# Patient Record
Sex: Female | Born: 1960 | Race: Black or African American | Hispanic: No | Marital: Single | Smoking: Current every day smoker
Health system: Southern US, Community
[De-identification: ages and names within clinical notes are randomized; demographics above are authoritative.]

## PROBLEM LIST (undated history)

## (undated) DIAGNOSIS — N289 Disorder of kidney and ureter, unspecified: Secondary | ICD-10-CM

## (undated) DIAGNOSIS — B182 Chronic viral hepatitis C: Secondary | ICD-10-CM

## (undated) DIAGNOSIS — J45909 Unspecified asthma, uncomplicated: Secondary | ICD-10-CM

## (undated) DIAGNOSIS — I509 Heart failure, unspecified: Secondary | ICD-10-CM

## (undated) DIAGNOSIS — J449 Chronic obstructive pulmonary disease, unspecified: Secondary | ICD-10-CM

## (undated) DIAGNOSIS — E079 Disorder of thyroid, unspecified: Secondary | ICD-10-CM

## (undated) DIAGNOSIS — I10 Essential (primary) hypertension: Secondary | ICD-10-CM

## (undated) DIAGNOSIS — E119 Type 2 diabetes mellitus without complications: Secondary | ICD-10-CM

## (undated) DIAGNOSIS — N186 End stage renal disease: Secondary | ICD-10-CM

## (undated) DIAGNOSIS — Z992 Dependence on renal dialysis: Secondary | ICD-10-CM

## (undated) HISTORY — PX: COLON SURGERY: SHX602

## (undated) HISTORY — PX: TONSILLECTOMY: SUR1361

## (undated) HISTORY — PX: VASCULAR SURGERY: SHX849

---

## 2015-06-15 ENCOUNTER — Emergency Department (HOSPITAL_COMMUNITY)
Admission: EM | Admit: 2015-06-15 | Discharge: 2015-06-15 | Disposition: A | Discharge status: Home / Self Care | Payer: Medicaid - Out of State | Attending: Emergency Medicine | Admitting: Emergency Medicine

## 2015-06-15 ENCOUNTER — Encounter (HOSPITAL_COMMUNITY): Payer: Self-pay | Admitting: Emergency Medicine

## 2015-06-15 DIAGNOSIS — L02811 Cutaneous abscess of head [any part, except face]: Secondary | ICD-10-CM | POA: Insufficient documentation

## 2015-06-15 DIAGNOSIS — Z8619 Personal history of other infectious and parasitic diseases: Secondary | ICD-10-CM | POA: Diagnosis not present

## 2015-06-15 DIAGNOSIS — L0291 Cutaneous abscess, unspecified: Secondary | ICD-10-CM

## 2015-06-15 DIAGNOSIS — J45909 Unspecified asthma, uncomplicated: Secondary | ICD-10-CM | POA: Diagnosis not present

## 2015-06-15 DIAGNOSIS — Z720 Tobacco use: Secondary | ICD-10-CM | POA: Diagnosis not present

## 2015-06-15 DIAGNOSIS — Z87448 Personal history of other diseases of urinary system: Secondary | ICD-10-CM | POA: Diagnosis not present

## 2015-06-15 DIAGNOSIS — I10 Essential (primary) hypertension: Secondary | ICD-10-CM | POA: Insufficient documentation

## 2015-06-15 HISTORY — DX: Unspecified asthma, uncomplicated: J45.909

## 2015-06-15 HISTORY — DX: Essential (primary) hypertension: I10

## 2015-06-15 HISTORY — DX: Disorder of kidney and ureter, unspecified: N28.9

## 2015-06-15 HISTORY — DX: Chronic viral hepatitis C: B18.2

## 2015-06-15 MED ORDER — HYDROCODONE-ACETAMINOPHEN 5-325 MG PO TABS: ORAL_TABLET | ORAL | Status: DC

## 2015-06-15 MED ORDER — SULFAMETHOXAZOLE-TRIMETHOPRIM 800-160 MG PO TABS: 1.0000 | ORAL_TABLET | Freq: Two times a day (BID) | ORAL | Status: DC

## 2015-06-15 MED ORDER — LIDOCAINE HCL 2 % IJ SOLN
10.0000 mL | Freq: Once | INTRAMUSCULAR | Status: AC
  Administered 2015-06-15: 10 mg via INTRADERMAL
  Filled 2015-06-15: qty 20

## 2015-06-15 MED ORDER — OXYCODONE-ACETAMINOPHEN 5-325 MG PO TABS
1.0000 | ORAL_TABLET | Freq: Once | ORAL | Status: AC
  Administered 2015-06-15: 1 via ORAL
  Filled 2015-06-15: qty 1

## 2015-06-15 NOTE — ED Notes (Signed)
Pt is a dialysis pt. Developed abscess to scalp 5 days ago. Was given Vancomycin 2 Gm IV yesterday at dialysis. Pt has hx of Hep C, takes Plavix, hx of IV drug abuse (clean for 3 years).

## 2015-06-15 NOTE — Discharge Instructions (Signed)
Return to the emergency room for wound check and packing removal in 48 hours.       If you develop fever, have vomiting or if the swelling and redness starts spreading , return to the emergency room immediately for a recheck.  Please follow with your primary care doctor in the next 2 days for a check-up. They must obtain records for further management.   Do not hesitate to return to the Emergency Department for any new, worsening or concerning symptoms.    Abscess An abscess (boil or furuncle) is an infected area on or under the skin. This area is filled with yellowish-white fluid (pus) and other material (debris). HOME CARE   Only take medicines as told by your doctor.  If you were given antibiotic medicine, take it as directed. Finish the medicine even if you start to feel better.  If gauze is used, follow your doctor's directions for changing the gauze.  To avoid spreading the infection:  Keep your abscess covered with a bandage.  Wash your hands well.  Do not share personal care items, towels, or whirlpools with others.  Avoid skin contact with others.  Keep your skin and clothes clean around the abscess.  Keep all doctor visits as told. GET HELP RIGHT AWAY IF:   You have more pain, puffiness (swelling), or redness in the wound site.  You have more fluid or blood coming from the wound site.  You have muscle aches, chills, or you feel sick.  You have a fever. MAKE SURE YOU:   Understand these instructions.  Will watch your condition.  Will get help right away if you are not doing well or get worse. Document Released: 04/07/2008 Document Revised: 04/20/2012 Document Reviewed: 01/02/2012 [facility] Patient Information 2015 [place], [place]. This information is not intended to replace advice given to you by your health care provider. Make sure you discuss any questions you have with your health care provider.

## 2015-06-15 NOTE — ED Provider Notes (Signed)
CSN: [id number]     Arrival date & time 06/15/15  1049 History  This chart was scribed for non-physician practitioner Wynetta Emery, PA-C working with Lavera Guise, MD by Littie Deeds, ED Scribe. This patient was seen in room TR05C/TR05C and the patient's care was started at 11:02 AM.       Chief Complaint  Patient presents with  . Abscess   The history is provided by the patient. No language interpreter was used.   HPI Comments: Latoya Patterson is a 54 y.o. female with a history of hepatitis C and renal disorder on dialysis who presents to the Emergency Department complaining of an abscess to her scalp that started 5 days ago. Patient notes that it started as a bump that has increased in size, which she believes worsened after washing her hair. She was able to express some pus drainage this morning. She also reports having some facial swelling, for which she was given Vancomycin IV yesterday at dialysis. Patient denies fever and chills. She also denies head trauma. Patient is on Plavix. She has a history of prior abscesses.   Past Medical History  Diagnosis Date  . Asthma   . Hypertension   . Renal disorder   . Hep C w/ coma, chronic    Past Surgical History  Procedure Laterality Date  . Colon surgery    . Vascular surgery    . Tonsillectomy     No family history on file. Social History  Substance Use Topics  . Smoking status: Current Every Day Smoker  . Smokeless tobacco: None  . Alcohol Use: No   OB History    No data available     Review of Systems A complete 10 system review of systems was obtained and all systems are negative except as noted in the HPI and PMH.     Allergies  Review of patient's allergies indicates no known allergies.  Home Medications   Prior to Admission medications   Not on File   BP 150/72 mmHg  Pulse 78  Temp(Src) 98.1 F (36.7 C) (Oral)  Resp 16  SpO2 98% Physical Exam  Constitutional: She is oriented to person, place, and time. She  appears well-developed and well-nourished. No distress.  HENT:  Head: Normocephalic and atraumatic.    Mouth/Throat: Oropharynx is clear and moist. No oropharyngeal exudate.  Eyes: Pupils are equal, round, and reactive to light.  Neck: Neck supple.  Cardiovascular: Normal rate.   Pulmonary/Chest: Effort normal.  Musculoskeletal: She exhibits no edema.  Neurological: She is alert and oriented to person, place, and time. No cranial nerve deficit.  Skin: Skin is warm and dry. No rash noted.  Psychiatric: She has a normal mood and affect. Her behavior is normal.  Nursing note and vitals reviewed.   ED Course  INCISION AND DRAINAGE Date/Time: 06/15/2015 12:08 PM Performed by: Wynetta Emery Authorized by: Wynetta Emery Consent: Verbal consent obtained. Risks and benefits: risks, benefits and alternatives were discussed Consent given by: patient Required items: required blood products, implants, devices, and special equipment available Patient identity confirmed: verbally with patient Type: abscess Body area: head Location details: scalp Anesthesia: local infiltration Local anesthetic: lidocaine 1% without epinephrine Anesthetic total: 2 ml Patient sedated: no Scalpel size: 11 Incision type: single straight Complexity: simple Drainage: purulent Drainage amount: scant Wound treatment: wound left open Packing material: 1/4 in iodoform gauze Patient tolerance: Patient tolerated the procedure well with no immediate complications    DIAGNOSTIC STUDIES: Oxygen Saturation is 98%  on room air, normal by my interpretation.    COORDINATION OF CARE: 11:05 AM-Discussed treatment plan which includes pain medications and I&D with patient/guardian at bedside and patient/guardian agreed to plan. Patient is not driving herself home.   Labs Review Labs Reviewed - No data to display  Imaging Review No results found. I personally reviewed and evaluated these images and lab results  as part of my medical decision-making.   EKG Interpretation None      MDM   Final diagnoses:  Abscess    Filed Vitals:   06/15/15 1056  BP: 150/72  Pulse: 78  Temp: 98.1 F (36.7 C)  TempSrc: Oral  Resp: 16  SpO2: 98%    Medications  oxyCODONE-acetaminophen (PERCOCET/ROXICET) 5-325 MG per tablet 1 tablet (1 tablet Oral Given 06/15/15 1113)  lidocaine (XYLOCAINE) 2 % (with pres) injection 200 mg (10 mg Intradermal Given 06/15/15 1113)    Latoya Patterson is a pleasant 54 y.o. female presenting with scalp abscess. No signs of systemic infection. I and D performed and wound is packed, there is a very scant amount of purulent drainage. Patient was extremely uncomfortable during the procedure so there was not an aggressive lysis of loculations. Patient is started on Bactrim. I have discussed this with pharmacist Homero Fellers considering she is on hemodialysis and he recommended standard treatment with no dosage adjustment for the hemodialysis.  Evaluation does not show pathology that would require ongoing emergent intervention or inpatient treatment. Pt is hemodynamically stable and mentating appropriately. Discussed findings and plan with patient/guardian, who agrees with care plan. All questions answered. Return precautions discussed and outpatient follow up given.   New Prescriptions   No medications on file   I personally performed the services described in this documentation, which was scribed in my presence. The recorded information has been reviewed and is accurate.   Wynetta Emery, PA-C 06/15/15 1245  Lavera Guise, MD 06/15/15 2022

## 2017-07-08 ENCOUNTER — Inpatient Hospital Stay (HOSPITAL_COMMUNITY)
Admission: EM | Admit: 2017-07-08 | Discharge: 2017-07-11 | DRG: 640 | Disposition: A | Discharge status: Home / Self Care | Payer: Medicaid - Out of State | Attending: Oncology | Admitting: Oncology

## 2017-07-08 ENCOUNTER — Other Ambulatory Visit: Payer: Self-pay

## 2017-07-08 ENCOUNTER — Emergency Department (HOSPITAL_COMMUNITY): Payer: Medicaid - Out of State

## 2017-07-08 ENCOUNTER — Encounter (HOSPITAL_COMMUNITY): Payer: Self-pay | Admitting: *Deleted

## 2017-07-08 DIAGNOSIS — I10 Essential (primary) hypertension: Secondary | ICD-10-CM

## 2017-07-08 DIAGNOSIS — F259 Schizoaffective disorder, unspecified: Secondary | ICD-10-CM | POA: Diagnosis present

## 2017-07-08 DIAGNOSIS — L97829 Non-pressure chronic ulcer of other part of left lower leg with unspecified severity: Secondary | ICD-10-CM | POA: Diagnosis present

## 2017-07-08 DIAGNOSIS — L97819 Non-pressure chronic ulcer of other part of right lower leg with unspecified severity: Secondary | ICD-10-CM | POA: Diagnosis present

## 2017-07-08 DIAGNOSIS — Z7902 Long term (current) use of antithrombotics/antiplatelets: Secondary | ICD-10-CM

## 2017-07-08 DIAGNOSIS — I5022 Chronic systolic (congestive) heart failure: Secondary | ICD-10-CM | POA: Diagnosis not present

## 2017-07-08 DIAGNOSIS — N186 End stage renal disease: Secondary | ICD-10-CM | POA: Diagnosis not present

## 2017-07-08 DIAGNOSIS — Z7982 Long term (current) use of aspirin: Secondary | ICD-10-CM

## 2017-07-08 DIAGNOSIS — I5020 Unspecified systolic (congestive) heart failure: Secondary | ICD-10-CM | POA: Diagnosis present

## 2017-07-08 DIAGNOSIS — I4581 Long QT syndrome: Secondary | ICD-10-CM | POA: Diagnosis present

## 2017-07-08 DIAGNOSIS — Z992 Dependence on renal dialysis: Secondary | ICD-10-CM

## 2017-07-08 DIAGNOSIS — N289 Disorder of kidney and ureter, unspecified: Secondary | ICD-10-CM

## 2017-07-08 DIAGNOSIS — K5900 Constipation, unspecified: Secondary | ICD-10-CM | POA: Diagnosis present

## 2017-07-08 DIAGNOSIS — B182 Chronic viral hepatitis C: Secondary | ICD-10-CM | POA: Diagnosis present

## 2017-07-08 DIAGNOSIS — I132 Hypertensive heart and chronic kidney disease with heart failure and with stage 5 chronic kidney disease, or end stage renal disease: Secondary | ICD-10-CM | POA: Diagnosis not present

## 2017-07-08 DIAGNOSIS — E119 Type 2 diabetes mellitus without complications: Secondary | ICD-10-CM

## 2017-07-08 DIAGNOSIS — F17210 Nicotine dependence, cigarettes, uncomplicated: Secondary | ICD-10-CM | POA: Diagnosis present

## 2017-07-08 DIAGNOSIS — I509 Heart failure, unspecified: Secondary | ICD-10-CM

## 2017-07-08 DIAGNOSIS — J449 Chronic obstructive pulmonary disease, unspecified: Secondary | ICD-10-CM

## 2017-07-08 DIAGNOSIS — F209 Schizophrenia, unspecified: Secondary | ICD-10-CM

## 2017-07-08 DIAGNOSIS — E1122 Type 2 diabetes mellitus with diabetic chronic kidney disease: Secondary | ICD-10-CM | POA: Diagnosis not present

## 2017-07-08 DIAGNOSIS — E1121 Type 2 diabetes mellitus with diabetic nephropathy: Secondary | ICD-10-CM

## 2017-07-08 DIAGNOSIS — Z79899 Other long term (current) drug therapy: Secondary | ICD-10-CM

## 2017-07-08 DIAGNOSIS — Z794 Long term (current) use of insulin: Secondary | ICD-10-CM

## 2017-07-08 DIAGNOSIS — E8770 Fluid overload, unspecified: Principal | ICD-10-CM | POA: Diagnosis present

## 2017-07-08 DIAGNOSIS — Z9115 Patient's noncompliance with renal dialysis: Secondary | ICD-10-CM

## 2017-07-08 DIAGNOSIS — E11622 Type 2 diabetes mellitus with other skin ulcer: Secondary | ICD-10-CM | POA: Diagnosis present

## 2017-07-08 DIAGNOSIS — B1920 Unspecified viral hepatitis C without hepatic coma: Secondary | ICD-10-CM

## 2017-07-08 DIAGNOSIS — E039 Hypothyroidism, unspecified: Secondary | ICD-10-CM

## 2017-07-08 HISTORY — DX: Chronic obstructive pulmonary disease, unspecified: J44.9

## 2017-07-08 HISTORY — DX: Heart failure, unspecified: I50.9

## 2017-07-08 HISTORY — DX: Type 2 diabetes mellitus without complications: E11.9

## 2017-07-08 HISTORY — DX: Disorder of thyroid, unspecified: E07.9

## 2017-07-08 HISTORY — DX: End stage renal disease: N18.6

## 2017-07-08 HISTORY — DX: Dependence on renal dialysis: Z99.2

## 2017-07-08 LAB — COMPREHENSIVE METABOLIC PANEL
ALBUMIN: 3.2 g/dL — AB (ref 3.5–5.0)
ALK PHOS: 180 U/L — AB (ref 38–126)
ALT: 18 U/L (ref 14–54)
ANION GAP: 16 — AB (ref 5–15)
AST: 30 U/L (ref 15–41)
BILIRUBIN TOTAL: 0.7 mg/dL (ref 0.3–1.2)
BUN: 66 mg/dL — ABNORMAL HIGH (ref 6–20)
CALCIUM: 9.3 mg/dL (ref 8.9–10.3)
CO2: 24 mmol/L (ref 22–32)
CREATININE: 11.26 mg/dL — AB (ref 0.44–1.00)
Chloride: 98 mmol/L — ABNORMAL LOW (ref 101–111)
GFR, EST AFRICAN AMERICAN: 4 mL/min — AB (ref >60)
GFR, EST NON AFRICAN AMERICAN: 3 mL/min — AB (ref >60)
Glucose, Bld: 353 mg/dL — ABNORMAL HIGH (ref 65–99)
Potassium: 4.3 mmol/L (ref 3.5–5.1)
SODIUM: 138 mmol/L (ref 135–145)
TOTAL PROTEIN: 7.3 g/dL (ref 6.5–8.1)

## 2017-07-08 LAB — CBC WITH DIFFERENTIAL/PLATELET
BASOS PCT: 0 %
Basophils Absolute: 0 10*3/uL (ref 0.0–0.1)
EOS ABS: 0.2 10*3/uL (ref 0.0–0.7)
Eosinophils Relative: 3 %
HCT: 34.1 % — ABNORMAL LOW (ref 36.0–46.0)
Hemoglobin: 10.4 g/dL — ABNORMAL LOW (ref 12.0–15.0)
Lymphocytes Relative: 31 %
Lymphs Abs: 1.4 10*3/uL (ref 0.7–4.0)
MCH: 27.2 pg (ref 26.0–34.0)
MCHC: 30.5 g/dL (ref 30.0–36.0)
MCV: 89.3 fL (ref 78.0–100.0)
MONOS PCT: 5 %
Monocytes Absolute: 0.2 10*3/uL (ref 0.1–1.0)
Neutro Abs: 2.7 10*3/uL (ref 1.7–7.7)
Neutrophils Relative %: 61 %
PLATELETS: 165 10*3/uL (ref 150–400)
RBC: 3.82 MIL/uL — ABNORMAL LOW (ref 3.87–5.11)
RDW: 15.3 % (ref 11.5–15.5)
WBC: 4.5 10*3/uL (ref 4.0–10.5)

## 2017-07-08 MED ORDER — SODIUM CHLORIDE 0.9% FLUSH: 3.0000 mL | INTRAVENOUS | Status: DC | PRN

## 2017-07-08 MED ORDER — SODIUM CHLORIDE 0.9 % IV SOLN: 250.0000 mL | INTRAVENOUS | Status: DC | PRN

## 2017-07-08 MED ORDER — CLONIDINE HCL 0.3 MG PO TABS
0.3000 mg | ORAL_TABLET | Freq: Three times a day (TID) | ORAL | Status: DC
  Administered 2017-07-08 – 2017-07-11 (×8): 0.3 mg via ORAL
  Filled 2017-07-08 (×8): qty 1

## 2017-07-08 MED ORDER — IPRATROPIUM-ALBUTEROL 0.5-2.5 (3) MG/3ML IN SOLN
3.0000 mL | Freq: Once | RESPIRATORY_TRACT | Status: AC
  Administered 2017-07-08: 3 mL via RESPIRATORY_TRACT
  Filled 2017-07-08: qty 3

## 2017-07-08 MED ORDER — INSULIN ASPART 100 UNIT/ML ~~LOC~~ SOLN
3.0000 [IU] | Freq: Three times a day (TID) | SUBCUTANEOUS | Status: DC
  Administered 2017-07-09 – 2017-07-10 (×3): 3 [IU] via SUBCUTANEOUS

## 2017-07-08 MED ORDER — DOCUSATE SODIUM 100 MG PO CAPS: 100.0000 mg | ORAL_CAPSULE | Freq: Every day | ORAL | Status: DC

## 2017-07-08 MED ORDER — HEPARIN SODIUM (PORCINE) 5000 UNIT/ML IJ SOLN
5000.0000 [IU] | Freq: Three times a day (TID) | INTRAMUSCULAR | Status: DC
  Administered 2017-07-08 – 2017-07-11 (×6): 5000 [IU] via SUBCUTANEOUS
  Filled 2017-07-08 (×6): qty 1

## 2017-07-08 MED ORDER — LISINOPRIL 10 MG PO TABS
10.0000 mg | ORAL_TABLET | Freq: Every day | ORAL | Status: DC
  Administered 2017-07-08 – 2017-07-11 (×4): 10 mg via ORAL
  Filled 2017-07-08 (×4): qty 1

## 2017-07-08 MED ORDER — ASPIRIN EC 81 MG PO TBEC
81.0000 mg | DELAYED_RELEASE_TABLET | Freq: Every day | ORAL | Status: DC
  Administered 2017-07-09 – 2017-07-11 (×4): 81 mg via ORAL
  Filled 2017-07-08 (×4): qty 1

## 2017-07-08 MED ORDER — NITROGLYCERIN 0.4 MG/HR TD PT24
0.4000 mg | MEDICATED_PATCH | Freq: Every day | TRANSDERMAL | Status: DC
  Administered 2017-07-08 – 2017-07-11 (×4): 0.4 mg via TRANSDERMAL
  Filled 2017-07-08 (×4): qty 1

## 2017-07-08 MED ORDER — INSULIN GLARGINE 100 UNIT/ML ~~LOC~~ SOLN
35.0000 [IU] | Freq: Every day | SUBCUTANEOUS | Status: DC
  Administered 2017-07-08 – 2017-07-10 (×3): 35 [IU] via SUBCUTANEOUS
  Filled 2017-07-08 (×4): qty 0.35

## 2017-07-08 MED ORDER — SENNOSIDES-DOCUSATE SODIUM 8.6-50 MG PO TABS
2.0000 | ORAL_TABLET | Freq: Every day | ORAL | Status: DC
  Administered 2017-07-08 – 2017-07-11 (×4): 2 via ORAL
  Filled 2017-07-08 (×4): qty 2

## 2017-07-08 MED ORDER — SIMVASTATIN 20 MG PO TABS
20.0000 mg | ORAL_TABLET | Freq: Every day | ORAL | Status: DC
  Administered 2017-07-08 – 2017-07-10 (×3): 20 mg via ORAL
  Filled 2017-07-08 (×3): qty 1

## 2017-07-08 MED ORDER — INSULIN ASPART 100 UNIT/ML ~~LOC~~ SOLN
0.0000 [IU] | Freq: Three times a day (TID) | SUBCUTANEOUS | Status: DC
  Administered 2017-07-09: 3 [IU] via SUBCUTANEOUS
  Administered 2017-07-09: 7 [IU] via SUBCUTANEOUS
  Administered 2017-07-10: 5 [IU] via SUBCUTANEOUS

## 2017-07-08 MED ORDER — PREDNISONE 20 MG PO TABS
60.0000 mg | ORAL_TABLET | Freq: Once | ORAL | Status: AC
Start: 2017-07-08 — End: 2017-07-08
  Administered 2017-07-08: 60 mg via ORAL
  Filled 2017-07-08: qty 3

## 2017-07-08 MED ORDER — QUETIAPINE FUMARATE 50 MG PO TABS
300.0000 mg | ORAL_TABLET | Freq: Two times a day (BID) | ORAL | Status: DC
  Administered 2017-07-09 – 2017-07-10 (×4): 300 mg via ORAL
  Filled 2017-07-08 (×3): qty 6
  Filled 2017-07-08 (×2): qty 1
  Filled 2017-07-08 (×2): qty 6

## 2017-07-08 MED ORDER — SODIUM CHLORIDE 0.9% FLUSH
3.0000 mL | Freq: Two times a day (BID) | INTRAVENOUS | Status: DC
  Administered 2017-07-09 – 2017-07-10 (×4): 3 mL via INTRAVENOUS

## 2017-07-08 MED ORDER — NIFEDIPINE ER OSMOTIC RELEASE 90 MG PO TB24
90.0000 mg | ORAL_TABLET | Freq: Every day | ORAL | Status: DC
  Administered 2017-07-08 – 2017-07-11 (×4): 90 mg via ORAL
  Filled 2017-07-08 (×4): qty 1

## 2017-07-08 MED ORDER — CLOPIDOGREL BISULFATE 75 MG PO TABS
75.0000 mg | ORAL_TABLET | Freq: Every day | ORAL | Status: DC
  Administered 2017-07-09 – 2017-07-11 (×4): 75 mg via ORAL
  Filled 2017-07-08 (×4): qty 1

## 2017-07-08 NOTE — H&P (Signed)
Date: 07/08/2017               Patient Name:  Latoya Patterson MRN: [id number]  DOB: September 02, 1961 Age / Sex: 56 y.o., female   PCP: No primary care provider on file.         Medical Service: Internal Medicine Teaching Service         Attending Physician: Dr. Cyndie ChimeGranfortuna, Genene ChurnJames M, MD    First Contact: Dr. Rogue BussingSantos-Snchez Pager: [phone]  Second Contact: Dr. Allena KatzPatel  Pager: [phone]       After Hours (After 5p/  First Contact Pager: [phone]  weekends / holidays): Second Contact Pager: [phone]   Chief Complaint: Volume overload   History of Present Illness:  Latoya Patterson is a 56 y.o. female with history of ESRD on HD, CHF, insulin-dependent T2DM, HTN, hypothyroidism,  COPD/asthma, HepC, and schizoaffective disorder who presents to the ED with volume overload after missing HD for 1 week. Patient lives in [place] and is visiting family in [place] and is planning to stay for 1 month. States that she got prior authorization from her insurance to receive HD in [place], but was told she could not receive HD at the dialysis center she went to. Endorses mild SOB, stating this is usually how she presents after missing HD. Denied chest pain, chest pressure, HA, cough, abdominal pain, and changes in bowel movements.   ED course: BP 192/81 in the setting of not taking medication today. She received clonidine 0.3 mg and lisinopril 10 with mild improvement in BP. She also received duoneb and PO prednisone 60 mg.   Meds:  ASA 81 mg daily  Nitro patch 0.4 mg q25h  Simvastatin 20 mg daily  Plavix 75 mg daily  Clonidine 0.3 mg TID  Lisinopril 10 mg daily  Procardia 90 mg daily  Lantus 50U QHS  Novolog 5U TID Seroquel 300 mg BID   Senokot 2 tablets daily   Allergies: Allergies as of 07/08/2017  . (No Known Allergies)   Past Medical History:  Diagnosis Date  . Asthma   . CHF (congestive heart failure) (HCC)   . COPD (chronic obstructive pulmonary disease) (HCC)   . Diabetes mellitus without complication (HCC)     . ESRD (end stage renal disease) on dialysis (HCC)   . Hep C w/ coma, chronic (HCC)   . Hypertension   . Renal disorder   . Thyroid disease     Family History:  Patient is adopted.   Social History:  Patient lives alone in [place]. Unemployed. Denies tobacco use and alcohol use. Reports marijuana use.   Review of Systems: A complete ROS was negative except as per HPI.   Physical Exam: Blood pressure (!) 197/71, pulse 71, temperature 98.1 F (36.7 C), temperature source Oral, resp. rate 13, SpO2 97 %.  General: pleasant female, well-nourished, well-developed, in no acute distress  Cardiac: regular rate and rhythm, nl S1/S2, II/VI systolic murmur best heard over upper L and R sternal border  Pulm: CTAB, no wheezes or crackles, no increased work of breathing  Abd: soft, NTND, bowel sounds present  Ext: warm and well perfused, mild peripheral edema bilaterally, 2+ DP pulses bilaterally  Derm: several open wounds on L and R LE, ulcers noted at L 2nd toe and R 3rd toe  Labs:  CBC  WBC 4.5  H/H 10.4/34.1  PLt 165 CMP 138  4.3  98  24  66  11.26  353 AST/ALT 30/18  AP 180 Alb 3.2  Ca 9.3  TP 7.3  TBili 0.7   EKG: personally reviewed my interpretation is ST at 78 bpm, QTC , no axis deviation, no LVH, no signs of acute ischemia   CXR: personally reviewed my interpretation is patent airway, no enlargement of cardiac silhouette, increased pulmonary vasculature markings, no focal consolidations/opacities/effusions noted   Assessment & Plan by Problem:  [name] is a 56 y.o. female with history of ESRD on HD, CHF, insulin-dependent T2DM, HTN, hypothyroidism,  COPD/asthma, HepC, and schizoaffective disorder who presents to the ED with volume overload after missing HD for 1 week.   # ESRD on HD p/w volume overload: in the setting of missing HD for 1 week  - Nephrology consulted. Patient will get HD tomorrow   # LE wounds:  - WOC consult   # HFrEF:  Unknown EF as patient lives in  [place] and no records on [place]  - Continue home ASA 81 mg daily  - Nitro patch 0.4 mg q25h  - Continue Simvastatin 20 mg daily  - Continue home Plavix 75 mg daily   # HTN: sBP 190s in the setting of not taking medication today  - Continue home Clonidine 0.3 mg TID + Lisinopril 10 mg daily + Procardia 90 mg daily   # T2DM: On Lantus 50U QHS at home   - Lantus 35U QHS + Novolog 3U TID + SSI-S - CBG monitoring  - Check A1c   # Hypothyroidism: no TSH on record. Patient does not know Synthroid dose.     # Schizoaffective disorder:  - Continue home Seroquel 300 mg BID    # Constipation:  - Continue home Senokot 2 tablets daily   Fluids: None  Electrolytes: CTM  GI: Renal diet  VTE ppx: SQ heparin   Code status: Full code, not confirmed on admission   Dispo: Admit patient to Observation with expected length of stay less than 2 midnights.  Signed: Burna Cash, MD  Internal Medicine PGY-1  [phone]

## 2017-07-08 NOTE — ED Notes (Signed)
Called pt for room no answer 

## 2017-07-08 NOTE — ED Provider Notes (Signed)
[facility]-EMERGENCY DEPT Provider Note   CSN: [id number] Arrival date & time: 07/08/17  1331     History   Chief Complaint Chief Complaint  Patient presents with  . Shortness of Breath    needs HD     HPI   Blood pressure (!) 198/77, pulse 79, temperature 98.1 F (36.7 C), temperature source Oral, resp. rate 17, SpO2 96 %.  Latoya Patterson is a 56 y.o. female with past medical history significant for COPD, hepatitis C, hypertension, ESRD on dialysis, visiting family from [place], was last dialyzed 6 days ago. She states that she started feeling short of breath yesterday but significantly worsened today. She states that she got preapproval from her insurance company to dialyze in [place] however when she went to the hemodialysis center on [address] they told her there was an issue and advised her to go to the emergency department.patient denies any chest pain. She endorses significant and painful lower tremor he peripheral edema bilaterally. She states that C feel she is volume overloaded no she needs to be dialyzed.  Past Medical History:  Diagnosis Date  . Asthma   . CHF (congestive heart failure) (HCC)   . COPD (chronic obstructive pulmonary disease) (HCC)   . Diabetes mellitus without complication (HCC)   . ESRD (end stage renal disease) on dialysis (HCC)   . Hep C w/ coma, chronic (HCC)   . Hypertension   . Renal disorder   . Thyroid disease     Patient Active Problem List   Diagnosis Date Noted  . Volume overload 07/08/2017  . Hepatitis C 07/08/2017  . COPD (chronic obstructive pulmonary disease) (HCC) 07/08/2017  . Heart failure (HCC) 07/08/2017  . Diabetes (HCC) 07/08/2017  . Schizoaffective disorder (HCC) 07/08/2017  . Constipation 07/08/2017  . Hypothyroidism 07/08/2017  . Hypertension   . ESRD (end stage renal disease) on dialysis [facility])     Past Surgical History:  Procedure Laterality Date  . COLON SURGERY    . TONSILLECTOMY    . VASCULAR  SURGERY      OB History    No data available       Home Medications    Prior to Admission medications   Medication Sig Start Date End Date Taking? Authorizing Provider  HYDROcodone-acetaminophen (NORCO/VICODIN) 5-325 MG per tablet Take 1-2 tablets by mouth every 6 hours as needed for pain and/or cough. 06/15/15   Nikala Walsworth, Joni Reining, PA-C  sulfamethoxazole-trimethoprim (BACTRIM DS) 800-160 MG per tablet Take 1 tablet by mouth 2 (two) times daily. 06/15/15   Ashlea Dusing, Joni Reining, PA-C    Family History Family History  Problem Relation Age of Onset  . Adopted: Yes    Social History Social History  Substance Use Topics  . Smoking status: Current Every Day Smoker    Packs/day: 0.33  . Smokeless tobacco: Never Used  . Alcohol use No     Allergies   Patient has no known allergies.   Review of Systems Review of Systems  A complete review of systems was obtained and all systems are negative except as noted in the HPI and PMH.    Physical Exam Updated Vital Signs BP (!) 229/88 (BP Location: Right Arm)   Pulse 83   Temp 97.9 F (36.6 C) (Oral)   Resp 15   Wt 93.6 kg (206 lb 4.8 oz)   SpO2 100%   Physical Exam  Constitutional: She is oriented to person, place, and time. She appears well-developed and well-nourished. No distress.  HENT:  Head: Normocephalic and atraumatic.  Mouth/Throat: Oropharynx is clear and moist.  Eyes: Pupils are equal, round, and reactive to light. Conjunctivae and EOM are normal.  Neck: Normal range of motion.  Cardiovascular: Normal rate, regular rhythm and intact distal pulses.   Pulmonary/Chest: Effort normal. She has wheezes.  Scattered moderate expiratory wheezing  Abdominal: Soft. There is no tenderness.  Musculoskeletal: Normal range of motion. She exhibits edema.  2+ pitting edema to midshin  Neurological: She is alert and oriented to person, place, and time.  Skin: She is not diaphoretic.  Psychiatric: She has a normal mood and  affect.  Nursing note and vitals reviewed.    ED Treatments / Results  Labs (all labs ordered are listed, but only abnormal results are displayed) Labs Reviewed  CBC WITH DIFFERENTIAL/PLATELET - Abnormal; Notable for the following:       Result Value   RBC 3.82 (*)    Hemoglobin 10.4 (*)    HCT 34.1 (*)    All other components within normal limits  COMPREHENSIVE METABOLIC PANEL - Abnormal; Notable for the following:    Chloride 98 (*)    Glucose, Bld 353 (*)    BUN 66 (*)    Creatinine, Ser 11.26 (*)    Albumin 3.2 (*)    Alkaline Phosphatase 180 (*)    GFR calc non Af Amer 3 (*)    GFR calc Af Amer 4 (*)    Anion gap 16 (*)    All other components within normal limits  HEMOGLOBIN A1C  RENAL FUNCTION PANEL  HIV ANTIBODY (ROUTINE TESTING)    EKG  EKG Interpretation None       Radiology Dg Chest 2 View  Result Date: 07/08/2017 CLINICAL DATA:  Shortness of Breath.  Renal failure EXAM: CHEST  2 VIEW COMPARISON:  None. FINDINGS: There is no edema or consolidation. There is cardiomegaly with pulmonary venous hypertension. No adenopathy. Left bracheal and axillary stent present. No evident bone lesions. IMPRESSION: Pulmonary vascular congestion without frank edema or consolidation. Electronically Signed   By: Bretta BangWilliam  Woodruff III M.D.   On: 07/08/2017 14:15    Procedures Procedures (including critical care time)  Medications Ordered in ED Medications  insulin glargine (LANTUS) injection 35 Units (35 Units Subcutaneous Given 07/08/17 2352)  insulin aspart (novoLOG) injection 0-9 Units (not administered)  insulin aspart (novoLOG) injection 3 Units (not administered)  NIFEdipine (PROCARDIA XL/ADALAT-CC) 24 hr tablet 90 mg (90 mg Oral Given 07/08/17 2354)  lisinopril (PRINIVIL,ZESTRIL) tablet 10 mg (10 mg Oral Given 07/08/17 1759)  cloNIDine (CATAPRES) tablet 0.3 mg (0.3 mg Oral Given 07/08/17 2352)  aspirin EC tablet 81 mg (not administered)  clopidogrel (PLAVIX) tablet 75 mg  (not administered)  simvastatin (ZOCOR) tablet 20 mg (20 mg Oral Given 07/08/17 2354)  nitroGLYCERIN (NITRODUR - Dosed in mg/24 hr) patch 0.4 mg (0.4 mg Transdermal Patch Applied 07/08/17 2355)  QUEtiapine (SEROQUEL) tablet 300 mg (300 mg Oral Given 07/08/17 2356)  senna-docusate (Senokot-S) tablet 2 tablet (2 tablets Oral Given 07/08/17 2354)  heparin injection 5,000 Units (5,000 Units Subcutaneous Given 07/08/17 2352)  sodium chloride flush (NS) 0.9 % injection 3 mL (not administered)  sodium chloride flush (NS) 0.9 % injection 3 mL (not administered)  0.9 %  sodium chloride infusion (not administered)  ipratropium-albuterol (DUONEB) 0.5-2.5 (3) MG/3ML nebulizer solution 3 mL (3 mLs Nebulization Given 07/08/17 1645)  predniSONE (DELTASONE) tablet 60 mg (60 mg Oral Given 07/08/17 1645)     Initial Impression /  Assessment and Plan / ED Course  I have reviewed the triage vital signs and the nursing notes.  Pertinent labs & imaging results that were available during my care of the patient were reviewed by me and considered in my medical decision making (see chart for details).     Vitals:   07/08/17 1600 07/08/17 1700 07/08/17 1745 07/08/17 2109  BP: (!) 198/77 (!) 195/78 (!) 197/71 (!) 229/88  Pulse: 79 72 71 83  Resp: 17 15 13 15   Temp:    97.9 F (36.6 C)  TempSrc:    Oral  SpO2: 96% 100% 97% 100%  Weight:    93.6 kg (206 lb 4.8 oz)    Medications  insulin glargine (LANTUS) injection 35 Units (35 Units Subcutaneous Given 07/08/17 2352)  insulin aspart (novoLOG) injection 0-9 Units (not administered)  insulin aspart (novoLOG) injection 3 Units (not administered)  NIFEdipine (PROCARDIA XL/ADALAT-CC) 24 hr tablet 90 mg (90 mg Oral Given 07/08/17 2354)  lisinopril (PRINIVIL,ZESTRIL) tablet 10 mg (10 mg Oral Given 07/08/17 1759)  cloNIDine (CATAPRES) tablet 0.3 mg (0.3 mg Oral Given 07/08/17 2352)  aspirin EC tablet 81 mg (not administered)  clopidogrel (PLAVIX) tablet 75 mg (not administered)    simvastatin (ZOCOR) tablet 20 mg (20 mg Oral Given 07/08/17 2354)  nitroGLYCERIN (NITRODUR - Dosed in mg/24 hr) patch 0.4 mg (0.4 mg Transdermal Patch Applied 07/08/17 2355)  QUEtiapine (SEROQUEL) tablet 300 mg (300 mg Oral Given 07/08/17 2356)  senna-docusate (Senokot-S) tablet 2 tablet (2 tablets Oral Given 07/08/17 2354)  heparin injection 5,000 Units (5,000 Units Subcutaneous Given 07/08/17 2352)  sodium chloride flush (NS) 0.9 % injection 3 mL (not administered)  sodium chloride flush (NS) 0.9 % injection 3 mL (not administered)  0.9 %  sodium chloride infusion (not administered)  ipratropium-albuterol (DUONEB) 0.5-2.5 (3) MG/3ML nebulizer solution 3 mL (3 mLs Nebulization Given 07/08/17 1645)  predniSONE (DELTASONE) tablet 60 mg (60 mg Oral Given 07/08/17 1645)    Alajiah Dutkiewicz is 56 y.o. female presenting with Shortness of breath, she's missed dialysis for the last 6 days. She is recently visiting the area, states that she had dialysis organized down here but was not preapproved by her insurance. Case discussed with nephrologist Dr. Lowell Guitar who recommends admission, unassigned internal medicine admission, discussed with Dr. Domingo Cocking  Final Clinical Impressions(s) / ED Diagnoses   Final diagnoses:  Renal disorder  Hypertension, unspecified type  ESRD (end stage renal disease) on dialysis [facility])    New Prescriptions Current Discharge Medication List       Kaylyn Lim 07/09/17 0005    Vanetta Mulders, MD 07/09/17 [phone]

## 2017-07-08 NOTE — ED Triage Notes (Signed)
Pt in c/o SOB, pt visiting family from [place] & has not had HD in 6 days, pt speaks in full sentences, pt A&O x4

## 2017-07-09 DIAGNOSIS — R011 Cardiac murmur, unspecified: Secondary | ICD-10-CM

## 2017-07-09 DIAGNOSIS — L97821 Non-pressure chronic ulcer of other part of left lower leg limited to breakdown of skin: Secondary | ICD-10-CM

## 2017-07-09 DIAGNOSIS — L97811 Non-pressure chronic ulcer of other part of right lower leg limited to breakdown of skin: Secondary | ICD-10-CM | POA: Diagnosis not present

## 2017-07-09 DIAGNOSIS — R770 Abnormality of albumin: Secondary | ICD-10-CM

## 2017-07-09 DIAGNOSIS — F259 Schizoaffective disorder, unspecified: Secondary | ICD-10-CM | POA: Diagnosis present

## 2017-07-09 DIAGNOSIS — F17210 Nicotine dependence, cigarettes, uncomplicated: Secondary | ICD-10-CM | POA: Diagnosis present

## 2017-07-09 DIAGNOSIS — L97819 Non-pressure chronic ulcer of other part of right lower leg with unspecified severity: Secondary | ICD-10-CM | POA: Diagnosis present

## 2017-07-09 DIAGNOSIS — E8770 Fluid overload, unspecified: Secondary | ICD-10-CM | POA: Diagnosis present

## 2017-07-09 DIAGNOSIS — J449 Chronic obstructive pulmonary disease, unspecified: Secondary | ICD-10-CM | POA: Diagnosis present

## 2017-07-09 DIAGNOSIS — E039 Hypothyroidism, unspecified: Secondary | ICD-10-CM | POA: Diagnosis present

## 2017-07-09 DIAGNOSIS — L97511 Non-pressure chronic ulcer of other part of right foot limited to breakdown of skin: Secondary | ICD-10-CM

## 2017-07-09 DIAGNOSIS — E8779 Other fluid overload: Secondary | ICD-10-CM | POA: Diagnosis not present

## 2017-07-09 DIAGNOSIS — E11621 Type 2 diabetes mellitus with foot ulcer: Secondary | ICD-10-CM | POA: Diagnosis not present

## 2017-07-09 DIAGNOSIS — E1122 Type 2 diabetes mellitus with diabetic chronic kidney disease: Secondary | ICD-10-CM | POA: Diagnosis present

## 2017-07-09 DIAGNOSIS — B182 Chronic viral hepatitis C: Secondary | ICD-10-CM | POA: Diagnosis present

## 2017-07-09 DIAGNOSIS — R768 Other specified abnormal immunological findings in serum: Secondary | ICD-10-CM

## 2017-07-09 DIAGNOSIS — I5020 Unspecified systolic (congestive) heart failure: Secondary | ICD-10-CM

## 2017-07-09 DIAGNOSIS — K5900 Constipation, unspecified: Secondary | ICD-10-CM | POA: Diagnosis present

## 2017-07-09 DIAGNOSIS — Z7982 Long term (current) use of aspirin: Secondary | ICD-10-CM | POA: Diagnosis not present

## 2017-07-09 DIAGNOSIS — E11622 Type 2 diabetes mellitus with other skin ulcer: Secondary | ICD-10-CM | POA: Diagnosis present

## 2017-07-09 DIAGNOSIS — Z7901 Long term (current) use of anticoagulants: Secondary | ICD-10-CM

## 2017-07-09 DIAGNOSIS — L97929 Non-pressure chronic ulcer of unspecified part of left lower leg with unspecified severity: Secondary | ICD-10-CM | POA: Diagnosis not present

## 2017-07-09 DIAGNOSIS — L97521 Non-pressure chronic ulcer of other part of left foot limited to breakdown of skin: Secondary | ICD-10-CM | POA: Diagnosis not present

## 2017-07-09 DIAGNOSIS — L97829 Non-pressure chronic ulcer of other part of left lower leg with unspecified severity: Secondary | ICD-10-CM | POA: Diagnosis present

## 2017-07-09 DIAGNOSIS — Z794 Long term (current) use of insulin: Secondary | ICD-10-CM

## 2017-07-09 DIAGNOSIS — B1920 Unspecified viral hepatitis C without hepatic coma: Secondary | ICD-10-CM | POA: Diagnosis not present

## 2017-07-09 DIAGNOSIS — I132 Hypertensive heart and chronic kidney disease with heart failure and with stage 5 chronic kidney disease, or end stage renal disease: Secondary | ICD-10-CM | POA: Diagnosis present

## 2017-07-09 DIAGNOSIS — N186 End stage renal disease: Secondary | ICD-10-CM | POA: Diagnosis present

## 2017-07-09 DIAGNOSIS — L97919 Non-pressure chronic ulcer of unspecified part of right lower leg with unspecified severity: Secondary | ICD-10-CM

## 2017-07-09 DIAGNOSIS — I4581 Long QT syndrome: Secondary | ICD-10-CM

## 2017-07-09 DIAGNOSIS — R0602 Shortness of breath: Secondary | ICD-10-CM | POA: Diagnosis present

## 2017-07-09 DIAGNOSIS — Z79899 Other long term (current) drug therapy: Secondary | ICD-10-CM | POA: Diagnosis not present

## 2017-07-09 DIAGNOSIS — Z9115 Patient's noncompliance with renal dialysis: Secondary | ICD-10-CM | POA: Diagnosis not present

## 2017-07-09 DIAGNOSIS — Z7902 Long term (current) use of antithrombotics/antiplatelets: Secondary | ICD-10-CM

## 2017-07-09 DIAGNOSIS — Z992 Dependence on renal dialysis: Secondary | ICD-10-CM | POA: Diagnosis not present

## 2017-07-09 DIAGNOSIS — R748 Abnormal levels of other serum enzymes: Secondary | ICD-10-CM

## 2017-07-09 DIAGNOSIS — E1121 Type 2 diabetes mellitus with diabetic nephropathy: Secondary | ICD-10-CM

## 2017-07-09 LAB — CBC
HEMATOCRIT: 31.6 % — AB (ref 36.0–46.0)
Hemoglobin: 9.9 g/dL — ABNORMAL LOW (ref 12.0–15.0)
MCH: 27.3 pg (ref 26.0–34.0)
MCHC: 31.3 g/dL (ref 30.0–36.0)
MCV: 87.1 fL (ref 78.0–100.0)
Platelets: 154 10*3/uL (ref 150–400)
RBC: 3.63 MIL/uL — ABNORMAL LOW (ref 3.87–5.11)
RDW: 14.9 % (ref 11.5–15.5)
WBC: 3.8 10*3/uL — ABNORMAL LOW (ref 4.0–10.5)

## 2017-07-09 LAB — HEMOGLOBIN A1C
Hgb A1c MFr Bld: 8.2 % — ABNORMAL HIGH (ref 4.8–5.6)
Mean Plasma Glucose: 188.64 mg/dL

## 2017-07-09 LAB — RENAL FUNCTION PANEL
Albumin: 3.1 g/dL — ABNORMAL LOW (ref 3.5–5.0)
Anion gap: 16 — ABNORMAL HIGH (ref 5–15)
BUN: 74 mg/dL — AB (ref 6–20)
CO2: 20 mmol/L — AB (ref 22–32)
Calcium: 9.3 mg/dL (ref 8.9–10.3)
Chloride: 100 mmol/L — ABNORMAL LOW (ref 101–111)
Creatinine, Ser: 11.3 mg/dL — ABNORMAL HIGH (ref 0.44–1.00)
GFR calc Af Amer: 4 mL/min — ABNORMAL LOW (ref >60)
GFR calc non Af Amer: 3 mL/min — ABNORMAL LOW (ref >60)
Glucose, Bld: 301 mg/dL — ABNORMAL HIGH (ref 65–99)
Phosphorus: 5.2 mg/dL — ABNORMAL HIGH (ref 2.5–4.6)
Potassium: 5.2 mmol/L — ABNORMAL HIGH (ref 3.5–5.1)
Sodium: 136 mmol/L (ref 135–145)

## 2017-07-09 LAB — MRSA PCR SCREENING: MRSA by PCR: NEGATIVE

## 2017-07-09 LAB — GLUCOSE, CAPILLARY
GLUCOSE-CAPILLARY: 216 mg/dL — AB (ref 65–99)
Glucose-Capillary: 304 mg/dL — ABNORMAL HIGH (ref 65–99)
Glucose-Capillary: 212 mg/dL — ABNORMAL HIGH (ref 65–99)

## 2017-07-09 LAB — HIV ANTIBODY (ROUTINE TESTING W REFLEX): HIV Screen 4th Generation wRfx: NONREACTIVE

## 2017-07-09 MED ORDER — SODIUM CHLORIDE 0.9 % IV SOLN: 100.0000 mL | INTRAVENOUS | Status: DC | PRN

## 2017-07-09 MED ORDER — HEPARIN SODIUM (PORCINE) 1000 UNIT/ML DIALYSIS: 20.0000 [IU]/kg | INTRAMUSCULAR | Status: DC | PRN

## 2017-07-09 MED ORDER — ALTEPLASE 2 MG IJ SOLR: 2.0000 mg | Freq: Once | INTRAMUSCULAR | Status: DC | PRN

## 2017-07-09 MED ORDER — HEPARIN SODIUM (PORCINE) 1000 UNIT/ML DIALYSIS: 1000.0000 [IU] | INTRAMUSCULAR | Status: DC | PRN

## 2017-07-09 MED ORDER — LIDOCAINE-PRILOCAINE 2.5-2.5 % EX CREA: 1.0000 "application " | TOPICAL_CREAM | CUTANEOUS | Status: DC | PRN

## 2017-07-09 MED ORDER — PENTAFLUOROPROP-TETRAFLUOROETH EX AERO: 1.0000 "application " | INHALATION_SPRAY | CUTANEOUS | Status: DC | PRN

## 2017-07-09 MED ORDER — MUPIROCIN CALCIUM 2 % EX CREA
TOPICAL_CREAM | Freq: Every day | CUTANEOUS | Status: DC
  Administered 2017-07-09 – 2017-07-10 (×2): via TOPICAL
  Filled 2017-07-09 (×2): qty 15

## 2017-07-09 MED ORDER — RENA-VITE PO TABS
1.0000 | ORAL_TABLET | Freq: Every day | ORAL | Status: DC
  Administered 2017-07-09 – 2017-07-10 (×2): 1 via ORAL
  Filled 2017-07-09 (×2): qty 1

## 2017-07-09 MED ORDER — LIDOCAINE HCL (PF) 1 % IJ SOLN: 5.0000 mL | INTRAMUSCULAR | Status: DC | PRN

## 2017-07-09 NOTE — Progress Notes (Addendum)
Inpatient Diabetes Program Recommendations  AACE/ADA: New Consensus Statement on Inpatient Glycemic Control (2015)  Target Ranges:  Prepandial:   less than 140 mg/dL      Peak postprandial:   less than 180 mg/dL (1-2 hours)      Critically ill patients:  140 - 180 mg/dL    Review of Glycemic Control  Diabetes history: DM 2 Current orders for Inpatient glycemic control: Lantus 35 units, Novolog Sensitive Correction 0-9 units tid + Novolog 3 units tid meal coverage  Inpatient Diabetes Program Recommendations:    Glucose 300's. Patient received PO prednisone 60 mg yesterday evening. If glucose remains elevated, consider increasing Lantus to 40 units.  A1c 8.2%  Thanks,  Christena DeemShannon Lendell Gallick RN, MSN, [facility] Inpatient Diabetes Coordinator Team Pager [phone] (8a-5p)

## 2017-07-09 NOTE — Progress Notes (Addendum)
PT Cancellation Note  Patient Details Name: Latoya Patterson MRN: [id number] DOB: 1961-09-27   Cancelled Treatment:    Reason Eval/Treat Not Completed: Patient at procedure or test/unavailable. Pt at HD. Will check back later.   [name] 07/09/2017, 11:41 AM [name] PT [phone]

## 2017-07-09 NOTE — Discharge Summary (Signed)
Name: Latoya Patterson MRN: [id number] DOB: 29-Nov-1960 56 y.o. PCP: No primary care provider on file.  Date of Admission: 07/08/2017  3:02 PM Date of Discharge: 07/12/2017 Attending Physician: No att. providers found  Discharge Diagnosis: 1. Volume overload   Principal Problem:   ESRD (end stage renal disease) on dialysis (HCC) Active Problems:   Volume overload   Hypertension   Hepatitis C   COPD (chronic obstructive pulmonary disease) (HCC)   Heart failure (HCC)   Diabetes (HCC)   Schizoaffective disorder (HCC)   Constipation   Hypothyroidism   Controlled type 2 diabetes mellitus with diabetic nephropathy, with long-term current use of insulin (HCC)   Discharge Medications: Allergies as of 07/11/2017   No Known Allergies     Medication List    STOP taking these medications   HYDROcodone-acetaminophen 5-325 MG tablet Commonly known as:  NORCO/VICODIN     TAKE these medications   albuterol 108 (90 Base) MCG/ACT inhaler Commonly known as:  PROVENTIL HFA;VENTOLIN HFA Inhale 2 puffs into the lungs every 6 (six) hours as needed for wheezing or shortness of breath.   aspirin EC 81 MG tablet Take 81 mg by mouth daily.   bisacodyl 5 MG EC tablet Commonly known as:  DULCOLAX Take 5 mg by mouth daily.   Calcium 600-200 MG-UNIT tablet Take 1 tablet by mouth 2 (two) times daily.   cloNIDine 0.3 MG tablet Commonly known as:  CATAPRES Take 0.3 mg by mouth 3 (three) times daily.   clopidogrel 75 MG tablet Commonly known as:  PLAVIX Take 75 mg by mouth at bedtime.   docusate sodium 100 MG capsule Commonly known as:  COLACE Take 100 mg by mouth daily after lunch.   insulin glargine 100 UNIT/ML injection Commonly known as:  LANTUS Inject 0.35 mLs (35 Units total) into the skin at bedtime. What changed:  how much to take   insulin lispro 100 UNIT/ML injection Commonly known as:  HUMALOG Inject 5 Units into the skin 3 (three) times daily before meals.   lisinopril 10  MG tablet Commonly known as:  PRINIVIL,ZESTRIL Take 10 mg by mouth daily.   meclizine 12.5 MG tablet Commonly known as:  ANTIVERT Take 12.5 mg by mouth 3 (three) times daily.   methocarbamol 750 MG tablet Commonly known as:  ROBAXIN Take 750 mg by mouth 2 (two) times daily.   multivitamin with minerals Tabs tablet Take 1 tablet by mouth at bedtime.   NIFEdipine 90 MG 24 hr tablet Commonly known as:  PROCARDIA XL/ADALAT-CC Take 90 mg by mouth daily.   nitroGLYCERIN 0.4 mg/hr patch Commonly known as:  NITRODUR - Dosed in mg/24 hr Place 1 patch onto the skin daily.   QUEtiapine 300 MG tablet Commonly known as:  SEROQUEL Take 300 mg by mouth 2 (two) times daily.   senna 8.6 MG Tabs tablet Commonly known as:  SENOKOT Take 8.6 mg by mouth daily.   simvastatin 40 MG tablet Commonly known as:  ZOCOR Take 40 mg by mouth at bedtime.            Discharge Care Instructions        Start     Ordered   07/11/17 0000  insulin glargine (LANTUS) 100 UNIT/ML injection  Daily at bedtime     07/11/17 1238   07/11/17 0000  Discharge instructions    Comments:  -Find a local dialysis center. This is extremely important. Try to start calling today as dialysis centers may be open for  people who have Saturday sessions. If not, call FIRST thing Monday morning. Getting dialysis will help you stay out of the hospital and enjoy your vacation   07/11/17 1238      Disposition and follow-up:   Ms.Latoya Patterson was discharged from [facility] in Good condition.  At the hospital follow up visit please address:  1.  Please address volume status and medication adherence.   2.  Labs / imaging needed at time of follow-up: BMP   3.  Pending labs/ test needing follow-up: None   Follow-up Appointments: None   Hospital Course by problem list:   Latoya Patterson is a 56 y.o. female with history of ESRD on HD, CHF, insulin-dependent T2DM, HTN, hypothyroidism,  COPD/asthma, HepC, and  schizoaffective disorder who presents to the ED with volume overload after missing HD for 1 week.   1. Volume overload: Patient presented to the ED with mild shortness of breath in the setting of missing HD for 1 week. She lives in [place] and will be staying in [place] 1 month while visiting family. She received 3 HD sessions during this admission with resolution of shortness of breath. Social work consulted to help patient establish with a local dialysis center. Unfortunately, patient left the hospital without arranging outpatient HD because she did not want to spend her vacation in the hospital. She was instructed to call dialysis centers in the area to set up HD as soon as possible.  She was continued on all her home medications for her chronic medical conditions.   Discharge Vitals:   BP (!) 145/66 (BP Location: Right Arm)   Pulse 76   Temp 98.2 F (36.8 C) (Oral)   Resp 18   Ht  (1.702 m)   Wt 188 lb 11.4 oz (85.6 kg)   SpO2 100%   BMI 29.56 kg/m   Pertinent Labs, Studies, and Procedures:  BMP Latest Ref Rng & Units 07/11/2017 07/09/2017 07/08/2017  Glucose 65 - 99 mg/dL 952(W) 413(K) 440(N)  BUN 6 - 20 mg/dL 02(V) 25(D) 66(Y)  Creatinine 0.44 - 1.00 mg/dL 4.03(K) 74.25(Z) 56.38(V)  Sodium 135 - 145 mmol/L 134(L) 136 138  Potassium 3.5 - 5.1 mmol/L 3.5 5.2(H) 4.3  Chloride 101 - 111 mmol/L 96(L) 100(L) 98(L)  CO2 22 - 32 mmol/L 25 20(L) 24  Calcium 8.9 - 10.3 mg/dL 9.1 9.3 9.3    Discharge Instructions: Discharge Instructions    Discharge instructions    Complete by:  As directed    -Find a local dialysis center. This is extremely important. Try to start calling today as dialysis centers may be open for people who have Saturday sessions. If not, call FIRST thing Monday morning. Getting dialysis will help you stay out of the hospital and enjoy your vacation      Signed: Burna Cash, MD  Internal Medicine PGY-1  P [phone]
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
## 2017-07-09 NOTE — Procedures (Signed)
Tolerating hemodialysis.  We are optimizing fluid removal and will try to arrange OP HD in [place]. Latoya Patterson C

## 2017-07-09 NOTE — Evaluation (Signed)
Physical Therapy Evaluation Patient Details Name: Latoya Patterson MRN: [id number] DOB: 1961-07-09 Today's Date: 07/09/2017   History of Present Illness  Pt is a 56 y/o female presenting with volume overload following missing a week of dialysis. PMH includes COPD, DM, CHF, hepatits C, ESRD on dialysis TTS, HTN, schizoaffective disorder, and current smoker.   Clinical Impression  Pt admitted secondary to problem above with deficits below. PTA, pt lives in [place] in supportive housing where staff are present 24/7. Pt reports she uses cane for short ambulation distances, but usually sits down most of the day or lays in the bed. Upon eval, pt limited by BLE pain, weakness, and decreased balance. Pt only agreeable to short ambulation distance within the room. Pt will be staying with her daughter at d/c. Feel pt is likely close to baseline, therefore feel no follow up services will be needed. Will continue to follow acutely to progress ambulation distance and ensure safety with stair management and other mobility at home.    Follow Up Recommendations No PT follow up;Supervision/Assistance - 24 hour    Equipment Recommendations  None recommended by PT    Recommendations for Other Services       Precautions / Restrictions Precautions Precautions: None Restrictions Weight Bearing Restrictions: No      Mobility  Bed Mobility               General bed mobility comments: In bathroom upon entry   Transfers Overall transfer level: Needs assistance Equipment used: None Transfers: Sit to/from Stand Sit to Stand: Supervision         General transfer comment: Supervision for safety   Ambulation/Gait Ambulation/Gait assistance: Supervision;Min guard Ambulation Distance (Feet): 10 Feet Assistive device: None Gait Pattern/deviations: Step-through pattern;Decreased stride length;Wide base of support Gait velocity: Decreased Gait velocity interpretation: Below normal speed for  age/gender General Gait Details: Slow, unsteady gait without use of AD, however, no overt LOB noted. Pt reports she normally uses cane for ambulation and refuses to use a RW. Refused any further ambulation this session.   Stairs            Wheelchair Mobility    Modified Rankin (Stroke Patients Only)       Balance Overall balance assessment: Needs assistance Sitting-balance support: No upper extremity supported;Feet supported Sitting balance-Leahy Scale: Good     Standing balance support: No upper extremity supported;During functional activity Standing balance-Leahy Scale: Fair                               Pertinent Vitals/Pain Pain Assessment: 0-10 Pain Score: 6  Pain Location: BLE  Pain Descriptors / Indicators: Constant Pain Intervention(s): Limited activity within patient's tolerance;Monitored during session;Repositioned    Home Living Family/patient expects to be discharged to:: Private residence Living Arrangements: Children Available Help at Discharge: Family;Available 24 hours/day Type of Home: House Home Access: Stairs to enter Entrance Stairs-Rails: None Entrance Stairs-Number of Steps: 5 (not sure exactly ) Home Layout: Two level Home Equipment: Cane - single point;Walker - 2 wheels Additional Comments: From [place] lives in supportive housing where staff is there 24/7. Daughters will be available while in [place].     Prior Function Level of Independence: Independent with assistive device(s)         Comments: Used cane for ambulation      Hand Dominance   Dominant Hand: Right    Extremity/Trunk Assessment   Upper Extremity Assessment Upper Extremity  Assessment: Overall WFL for tasks assessed    Lower Extremity Assessment Lower Extremity Assessment: RLE deficits/detail;LLE deficits/detail;Generalized weakness RLE Deficits / Details: Numbness and pain reported from waist down.  LLE Deficits / Details: Numbness and pain reported  from waist down.     Cervical / Trunk Assessment Cervical / Trunk Assessment: Normal  Communication   Communication: No difficulties  Cognition Arousal/Alertness: Awake/alert Behavior During Therapy: WFL for tasks assessed/performed Overall Cognitive Status: No family/caregiver present to determine baseline cognitive functioning                                        General Comments General comments (skin integrity, edema, etc.): Pt reports she doesn't usually walk alot and stays in the bed or in a chair. Pt likely close to baseline.     Exercises     Assessment/Plan    PT Assessment Patient needs continued PT services  PT Problem List Decreased strength;Decreased balance;Decreased activity tolerance;Decreased mobility;Decreased knowledge of use of DME;Decreased safety awareness;Decreased knowledge of precautions;Pain;Impaired sensation       PT Treatment Interventions DME instruction;Gait training;Functional mobility training;Stair training;Therapeutic activities;Therapeutic exercise;Balance training;Neuromuscular re-education;Patient/family education    PT Goals (Current goals can be found in the Care Plan section)  Acute Rehab PT Goals Patient Stated Goal: to get out of the hospital  PT Goal Formulation: With patient Time For Goal Achievement: 07/16/17 Potential to Achieve Goals: Good    Frequency Min 3X/week   Barriers to discharge        Co-evaluation               AM-PAC PT "6 Clicks" Daily Activity  Outcome Measure Difficulty turning over in bed (including adjusting bedclothes, sheets and blankets)?: None Difficulty moving from lying on back to sitting on the side of the bed? : None Difficulty sitting down on and standing up from a chair with arms (e.g., wheelchair, bedside commode, etc,.)?: None Help needed moving to and from a bed to chair (including a wheelchair)?: A Little Help needed walking in hospital room?: A Little Help needed  climbing 3-5 steps with a railing? : A Little 6 Click Score: 21    End of Session   Activity Tolerance: Patient tolerated treatment well Patient left: in bed;with call bell/phone within reach (sitting EOB ) Nurse Communication: Mobility status PT Visit Diagnosis: Unsteadiness on feet (R26.81);Pain Pain - Right/Left:  (bilat ) Pain - part of body: Leg    Time: 1551-1606 PT Time Calculation (min) (ACUTE ONLY): 15 min   Charges:   PT Evaluation $PT Eval Low Complexity: 1 Low     PT G Codes:   PT G-Codes **NOT FOR INPATIENT CLASS** Functional Assessment Tool Used: AM-PAC 6 Clicks Basic Mobility Functional Limitation: Mobility: Walking and moving around Mobility: Walking and Moving Around Current Status ([id number](G8978): At least 20 percent but less than 40 percent impaired, limited or restricted Mobility: Walking and Moving Around Goal Status [id number](G8979): At least 1 percent but less than 20 percent impaired, limited or restricted    Gladys DammeBrittany Titus Drone, PT, DPT  Acute Rehabilitation Services  Pager: [phone]   Lehman PromBrittany S [place] Lapaglia 07/09/2017, 4:30 PM

## 2017-07-09 NOTE — Progress Notes (Signed)
   Subjective:  No acute events overnight. Patient denies chest pressure, chest pain, and shortness of breath this AM. No complaints today.   Objective:  Vital signs in last 24 hours: Vitals:   07/08/17 1700 07/08/17 1745 07/08/17 2109 07/09/17 0502  BP: (!) 195/78 (!) 197/71 (!) 229/88 (!) 168/65  Pulse: 72 71 83 68  Resp: 15 13 15 15   Temp:   97.9 F (36.6 C) 98.2 F (36.8 C)  TempSrc:   Oral Oral  SpO2: 100% 97% 100% 99%  Weight:   206 lb 4.8 oz (93.6 kg)    General: pleasant female, well-nourished, well-developed, in no acute distress  Cardiac: regular rate and rhythm, nl S1/S2, no murmurs, rubs or gallops  Pulm: mild bibasilar crackles, no wheezes, no increased work of breathing  Ext: warm and well perfused, 1+ peripheral edema up to knees    Assessment/Plan:  Latoya Patterson is a 56 y.o. female with history of ESRD on HD, CHF, insulin-dependent T2DM, HTN, hypothyroidism,  COPD/asthma, HepC, and schizoaffective disorder who presents to the ED with volume overload after missing HD for 1 week.   # ESRD on HD p/w volume overload: in the setting of missing HD for 1 week  - Nephrology following. Plan for HD session tomorrow.  - SW not available when contacted   # LE wounds:  - WOC consult   # HFrEF:  Unknown EF as patient lives in [place] and no records on [place]  - Continue home ASA 81 mg daily  - Nitro patch 0.4 mg q25h  - Continue Simvastatin 20 mg daily  - Continue home Plavix 75 mg daily   # HTN: sBP 190s in the setting of not taking medication today  - Continue home Clonidine 0.3 mg TID + Lisinopril 10 mg daily + Procardia 90 mg daily   # T2DM: On Lantus 50U QHS at home   - Lantus 35U QHS + Novolog 3U TID + SSI-S - CBG monitoring  - Check A1c   # Hypothyroidism: no TSH on record. Patient does not know Synthroid dose.     # Schizoaffective disorder:  - Continue home Seroquel 300 mg BID    # Constipation:  - Continue home Senokot 2 tablets daily   Fluids:  None  Electrolytes: CTM  GI: Renal diet  VTE ppx: SQ heparin   Code status: Full code, not confirmed on admission   Dispo: Anticipated discharge in approximately 2-3 day(s).   Burna CashIdalys Santos-Sanchez, MD  Internal Medicine PGY-1  P [phone]

## 2017-07-09 NOTE — Consult Note (Signed)
WOC Nurse wound consult note Reason for Consult: Consult requested for legs and right foot.  Wound type: Pt has generalized edema to BLE.  No open wounds to left leg.  Right leg with 2 full thickness stasis ulcers; .3X.3X.2cm and .5X.8X.2cm.  Both are red and moist, no odor, mod amt yellow drainage. Right 3rd toe with partial thickness wound to anterior surface; .8X.8X.1cm, red and dry, no odor, drainage, or fluctuance. Dressing procedure/placement/frequency: Foam dressing to absorb drainage and promote healing.  Bactroban to promote healing to right toe wound and protect with foam.  Discussed plan of care with patient and she verbalized understanding. Please re-consult if further assistance is needed.  Thank-you,  Cammie Mcgeeawn Alyssandra Hulsebus MSN, RN, CWOCN, [place]-AP, CNS [phone]

## 2017-07-09 NOTE — Consult Note (Signed)
Latoya Patterson is a 56 y.o. female with history of ESRD on HD TTS in [place] at [facility].  Her last treatment was a week ago.  She presented without appropriate dialysis arrangements for transient travel and presented to [facility] and reported weeping of fluid from legs.  Her other problems include CHF, insulin treated DM, HTN, hypothyroidism,  COPD/asthma, HepC, and schizoaffective disorder.  She wants to stay with family for a month..    Past Medical History:  Diagnosis Date  . Asthma   . CHF (congestive heart failure) ([place])   . COPD (chronic obstructive pulmonary disease) ([place])   . Diabetes mellitus without complication ([place])   . ESRD (end stage renal disease) on dialysis ([place])   . Hep C w/ coma, chronic ([place])   . Hypertension   . Renal disorder   . Thyroid disease    Past Surgical History:  Procedure Laterality Date  . COLON SURGERY    . TONSILLECTOMY    . VASCULAR SURGERY     Social History:  reports that she has been smoking.  She has been smoking about 0.33 packs per day. She has never used smokeless tobacco. She reports that she uses drugs, including Marijuana. She reports that she does not drink alcohol. Allergies: No Known Allergies Family History  Problem Relation Age of Onset  . Adopted: Yes    Medications:  Scheduled: . aspirin EC  81 mg Oral Daily  . cloNIDine  0.3 mg Oral TID  . clopidogrel  75 mg Oral Daily  . heparin  5,000 Units Subcutaneous Q8H  . insulin aspart  0-9 Units Subcutaneous TID WC  . insulin aspart  3 Units Subcutaneous TID WC  . insulin glargine  35 Units Subcutaneous QHS  . lisinopril  10 mg Oral Daily  . mupirocin cream   Topical Daily  . NIFEdipine  90 mg Oral Daily  . nitroGLYCERIN  0.4 mg Transdermal Daily  . QUEtiapine  300 mg Oral BID  . senna-docusate  2 tablet Oral Daily  . simvastatin  20 mg Oral q1800  . sodium chloride flush  3 mL Intravenous Q12H     ROS: as per HPI Blood pressure 132/62, pulse 69, temperature  97.7 F (36.5 C), temperature source Oral, resp. rate 18, weight 93.9 kg (207 lb 0.2 oz), SpO2 97 %.  General appearance: alert and cooperative Head: Normocephalic, without obvious abnormality, atraumatic Eyes: negative Resp: clear to auscultation bilaterally Chest wall: no tenderness Cardio: regular rate and rhythm, S1, S2 normal, no murmur, click, rub or gallop GI: soft, non-tender; bowel sounds normal; no masses,  no organomegaly Extremities: edema 2+ bandage on right pretibial region   LUE AVF Neurologic: Grossly normal Results for orders placed or performed during the hospital encounter of 07/08/17 (from the past 48 hour(s))  CBC with Differential     Status: Abnormal   Collection Time: 07/08/17  1:54 PM  Result Value Ref Range   WBC 4.5 4.0 - 10.5 K/uL   RBC 3.82 (L) 3.87 - 5.11 MIL/uL   Hemoglobin 10.4 (L) 12.0 - 15.0 g/dL   HCT 34.1 (L) 36.0 - 46.0 %   MCV 89.3 78.0 - 100.0 fL   MCH 27.2 26.0 - 34.0 pg   MCHC 30.5 30.0 - 36.0 g/dL   RDW 15.3 11.5 - 15.5 %   Platelets 165 150 - 400 K/uL   Neutrophils Relative % 61 %   Neutro Abs 2.7 1.7 - 7.7 K/uL   Lymphocytes  Relative 31 %   Lymphs Abs 1.4 0.7 - 4.0 K/uL   Monocytes Relative 5 %   Monocytes Absolute 0.2 0.1 - 1.0 K/uL   Eosinophils Relative 3 %   Eosinophils Absolute 0.2 0.0 - 0.7 K/uL   Basophils Relative 0 %   Basophils Absolute 0.0 0.0 - 0.1 K/uL  Comprehensive metabolic panel     Status: Abnormal   Collection Time: 07/08/17  1:54 PM  Result Value Ref Range   Sodium 138 135 - 145 mmol/L   Potassium 4.3 3.5 - 5.1 mmol/L   Chloride 98 (L) 101 - 111 mmol/L   CO2 24 22 - 32 mmol/L   Glucose, Bld 353 (H) 65 - 99 mg/dL   BUN 66 (H) 6 - 20 mg/dL   Creatinine, Ser 11.26 (H) 0.44 - 1.00 mg/dL   Calcium 9.3 8.9 - 10.3 mg/dL   Total Protein 7.3 6.5 - 8.1 g/dL   Albumin 3.2 (L) 3.5 - 5.0 g/dL   AST 30 15 - 41 U/L   ALT 18 14 - 54 U/L   Alkaline Phosphatase 180 (H) 38 - 126 U/L   Total Bilirubin 0.7 0.3 - 1.2 mg/dL    GFR calc non Af Amer 3 (L) >60 mL/min   GFR calc Af Amer 4 (L) >60 mL/min    Comment: (NOTE) The eGFR has been calculated using the CKD EPI equation. This calculation has not been validated in all clinical situations. eGFR's persistently <60 mL/min signify possible Chronic Kidney Disease.    Anion gap 16 (H) 5 - 15  Hemoglobin A1c     Status: Abnormal   Collection Time: 07/09/17  4:23 AM  Result Value Ref Range   Hgb A1c MFr Bld 8.2 (H) 4.8 - 5.6 %    Comment: (NOTE) Pre diabetes:          5.7%-6.4% Diabetes:              >6.4% Glycemic control for   <7.0% adults with diabetes    Mean Plasma Glucose 188.64 mg/dL  Renal function panel     Status: Abnormal   Collection Time: 07/09/17  4:23 AM  Result Value Ref Range   Sodium 136 135 - 145 mmol/L   Potassium 5.2 (H) 3.5 - 5.1 mmol/L    Comment: DELTA CHECK NOTED   Chloride 100 (L) 101 - 111 mmol/L   CO2 20 (L) 22 - 32 mmol/L   Glucose, Bld 301 (H) 65 - 99 mg/dL   BUN 74 (H) 6 - 20 mg/dL   Creatinine, Ser 11.30 (H) 0.44 - 1.00 mg/dL   Calcium 9.3 8.9 - 10.3 mg/dL   Phosphorus 5.2 (H) 2.5 - 4.6 mg/dL   Albumin 3.1 (L) 3.5 - 5.0 g/dL   GFR calc non Af Amer 3 (L) >60 mL/min   GFR calc Af Amer 4 (L) >60 mL/min    Comment: (NOTE) The eGFR has been calculated using the CKD EPI equation. This calculation has not been validated in all clinical situations. eGFR's persistently <60 mL/min signify possible Chronic Kidney Disease.    Anion gap 16 (H) 5 - 15  MRSA PCR Screening     Status: None   Collection Time: 07/09/17  7:45 AM  Result Value Ref Range   MRSA by PCR NEGATIVE NEGATIVE    Comment:        The GeneXpert MRSA Assay (FDA approved for NASAL specimens only), is one component of a comprehensive MRSA colonization surveillance program. It is  not intended to diagnose MRSA infection nor to guide or monitor treatment for MRSA infections.   CBC     Status: Abnormal   Collection Time: 07/09/17  8:30 AM  Result Value  Ref Range   WBC 3.8 (L) 4.0 - 10.5 K/uL   RBC 3.63 (L) 3.87 - 5.11 MIL/uL   Hemoglobin 9.9 (L) 12.0 - 15.0 g/dL   HCT 31.6 (L) 36.0 - 46.0 %   MCV 87.1 78.0 - 100.0 fL   MCH 27.3 26.0 - 34.0 pg   MCHC 31.3 30.0 - 36.0 g/dL   RDW 14.9 11.5 - 15.5 %   Platelets 154 150 - 400 K/uL   Dg Chest 2 View  Result Date: 07/08/2017 CLINICAL DATA:  Shortness of Breath.  Renal failure EXAM: CHEST  2 VIEW COMPARISON:  None. FINDINGS: There is no edema or consolidation. There is cardiomegaly with pulmonary venous hypertension. No adenopathy. Left bracheal and axillary stent present. No evident bone lesions. IMPRESSION: Pulmonary vascular congestion without frank edema or consolidation. Electronically Signed   By: Lowella Grip III M.D.   On: 07/08/2017 14:15    Assessment:  1 ESRD 2 Volume overload 3 Needs dialysis arrangements Plan: 1 HD again in AM 2 CLIP fro OP HD  Natika Geyer C 07/09/2017, 1:27 PM

## 2017-07-10 LAB — GLUCOSE, CAPILLARY
Glucose-Capillary: 144 mg/dL — ABNORMAL HIGH (ref 65–99)
Glucose-Capillary: 48 mg/dL — ABNORMAL LOW (ref 65–99)
Glucose-Capillary: 297 mg/dL — ABNORMAL HIGH (ref 65–99)
Glucose-Capillary: 267 mg/dL — ABNORMAL HIGH (ref 65–99)

## 2017-07-10 LAB — HEPATITIS B SURFACE ANTIGEN

## 2017-07-10 LAB — TSH: TSH: 1.489 u[IU]/mL (ref 0.350–4.500)

## 2017-07-10 MED ORDER — HEPARIN SODIUM (PORCINE) 1000 UNIT/ML DIALYSIS: 20.0000 [IU]/kg | INTRAMUSCULAR | Status: DC | PRN

## 2017-07-10 MED ORDER — LIDOCAINE-PRILOCAINE 2.5-2.5 % EX CREA: 1.0000 "application " | TOPICAL_CREAM | CUTANEOUS | Status: DC | PRN

## 2017-07-10 MED ORDER — LIDOCAINE HCL (PF) 1 % IJ SOLN: 5.0000 mL | INTRAMUSCULAR | Status: DC | PRN

## 2017-07-10 MED ORDER — PENTAFLUOROPROP-TETRAFLUOROETH EX AERO: 1.0000 "application " | INHALATION_SPRAY | CUTANEOUS | Status: DC | PRN

## 2017-07-10 MED ORDER — ALTEPLASE 2 MG IJ SOLR: 2.0000 mg | Freq: Once | INTRAMUSCULAR | Status: DC | PRN

## 2017-07-10 MED ORDER — HEPARIN SODIUM (PORCINE) 1000 UNIT/ML DIALYSIS: 1000.0000 [IU] | INTRAMUSCULAR | Status: DC | PRN

## 2017-07-10 MED ORDER — SODIUM CHLORIDE 0.9 % IV SOLN: 100.0000 mL | INTRAVENOUS | Status: DC | PRN

## 2017-07-10 NOTE — Progress Notes (Signed)
PT Cancellation Note  Patient Details Name: Latoya McmurrayLula Prather MRN: [id number] DOB: 01/20/1961   Cancelled Treatment:    Reason Eval/Treat Not Completed: Patient at procedure or test/unavailable;Patient declined, no reason specified;Fatigue/lethargy limiting ability to participate. Attempted to see patient x3 today.  Patient in HD this am, and patient declined x2 this pm - too tired. Noted 2 bags of candy on patient's bed.  RN notified. Will return at later time for PT session.   Vena AustriaSusan H Atsushi Yom 07/10/2017, 7:47 PM Durenda HurtSusan H. Renaldo Fiddleravis, PT, [facility] Acute Rehab Services Pager [phone]

## 2017-07-10 NOTE — Progress Notes (Signed)
CRITICAL VALUE ALERT  Critical Value:  48  Date & Time Notied:  07/10/17  1221  Orders Received/Actions taken: Snack given, rechecked 30 minutes later. Blood sugar 144. Will continue to monitor.

## 2017-07-10 NOTE — Procedures (Signed)
On HD with attempt at vol removal. We are searching for a HD facility in [place] that will accept her after the alleged behavior at a clinic on Tuesday..  If one cannot be found she should return home to the [place] or seek alternative care. Latoya Patterson

## 2017-07-10 NOTE — Progress Notes (Signed)
Inpatient Diabetes Program Recommendations  AACE/ADA: New Consensus Statement on Inpatient Glycemic Control (2015)  Target Ranges:  Prepandial:   less than 140 mg/dL      Peak postprandial:   less than 180 mg/dL (1-2 hours)      Critically ill patients:  140 - 180 mg/dL   Lab Results  Component Value Date   GLUCAP 48 (L) 07/10/2017   HGBA1C 8.2 (H) 07/09/2017    Review of Glycemic Control  Inpatient Diabetes Program Recommendations:    Unsure why glucose was not checked this am before going to HD. Glucose at lunch in the 40's. Unsure of PO intake this am around breakfast time.   Thanks,  Christena DeemShannon Elowyn Raupp RN, MSN, [facility] Inpatient Diabetes Coordinator Team Pager [phone] (8a-5p)

## 2017-07-10 NOTE — Progress Notes (Signed)
   Subjective:  No acute events overnight. She states that she still has a lot of fluid on and needs more to be taken off. Patient denies chest pressure, chest pain, and shortness of breath this AM. No complaints today.   Objective:  Vital signs in last 24 hours: Vitals:   07/10/17 0830 07/10/17 0900 07/10/17 0930 07/10/17 1000  BP: (!) 104/55 (!) 107/53 (!) 109/59 118/60  Pulse: 73 72 73 74  Resp:      Temp:      TempSrc:      SpO2:      Weight:       General: Laying in bed comfortably, NAD HEENT: [place]/AT, EOMI, no scleral icterus, PERRL Cardiac: RRR, No R/M/G appreciated Pulm: normal effort, CTAB Abd: soft, non tender, non distended, BS normal Ext: extremities well perfused, 2+ peripheral edema Neuro: alert and oriented X3, cranial nerves II-XII grossly intact  Assessment/Plan:  Latoya Patterson is a 56 y.o. female with history of ESRD on HD, CHF, insulin-dependent T2DM, HTN, hypothyroidism,  COPD/asthma, HepC, and schizoaffective disorder who presents to the ED with volume overload after missing HD for 1 week.   ESRD on HD  Presented with volume overload in the setting of missing HD for 1 week. S/p HD x 2.  - Nephrology following - No placement for outpatient dialysis as of now. Patient was denied [facility] due to alleged verbal threats. Unclear if she will be accepted elsewhere.   HFrEF Unknown EF as patient lives in [place] and no records on [place]  - Continue home ASA 81 mg daily  - Nitro patch 0.4 mg q25h  - Continue Simvastatin 20 mg daily  - Continue home Plavix 75 mg daily   HTN BP now stable after 2 dialysis sessions. BP 109/62. - Continue home Clonidine 0.3 mg TID + Lisinopril 10 mg daily + Procardia 90 mg daily   T2DM A1C 8.2. On Lantus 50U QHS at home   - Lantus 35U QHS + Novolog 3U TID + SSI-S - CBG monitoring   Hypothyroidism No TSH on record. Patient does not know Synthroid dose.    -TSH level ordered, f/u  Schizoaffective disorder:  - Continue home  Seroquel 300 mg BID    Fluids: None  Electrolytes: CTM  GI: Renal diet  VTE ppx: SQ heparin   Code status: Full code, not confirmed on admission   Dispo: Anticipated discharge in approximately 1-2 day(s).   Juluis MireSam LaCroce, MD Internal Medicine PGY1 Pager # [phone]

## 2017-07-10 NOTE — Progress Notes (Signed)
Patient had eaten four chocolate snacks at bedside. I educated the patient on this increasing her blood sugar and provided diabetes education. She is sitting on the side of the bed, bed alarm is on. Will continue to monitor.

## 2017-07-11 DIAGNOSIS — E039 Hypothyroidism, unspecified: Secondary | ICD-10-CM

## 2017-07-11 LAB — CBC
HCT: 32.8 % — ABNORMAL LOW (ref 36.0–46.0)
Hemoglobin: 10.1 g/dL — ABNORMAL LOW (ref 12.0–15.0)
MCH: 27.4 pg (ref 26.0–34.0)
MCHC: 30.8 g/dL (ref 30.0–36.0)
MCV: 89.1 fL (ref 78.0–100.0)
PLATELETS: 201 10*3/uL (ref 150–400)
RBC: 3.68 MIL/uL — AB (ref 3.87–5.11)
RDW: 15 % (ref 11.5–15.5)
WBC: 6.4 10*3/uL (ref 4.0–10.5)

## 2017-07-11 LAB — RENAL FUNCTION PANEL
ALBUMIN: 2.7 g/dL — AB (ref 3.5–5.0)
Anion gap: 13 (ref 5–15)
BUN: 36 mg/dL — AB (ref 6–20)
CALCIUM: 9.1 mg/dL (ref 8.9–10.3)
CO2: 25 mmol/L (ref 22–32)
CREATININE: 5.48 mg/dL — AB (ref 0.44–1.00)
Chloride: 96 mmol/L — ABNORMAL LOW (ref 101–111)
GFR, EST AFRICAN AMERICAN: 9 mL/min — AB (ref >60)
GFR, EST NON AFRICAN AMERICAN: 8 mL/min — AB (ref >60)
Glucose, Bld: 187 mg/dL — ABNORMAL HIGH (ref 65–99)
Phosphorus: 2.2 mg/dL — ABNORMAL LOW (ref 2.5–4.6)
Potassium: 3.5 mmol/L (ref 3.5–5.1)
SODIUM: 134 mmol/L — AB (ref 135–145)

## 2017-07-11 LAB — GLUCOSE, CAPILLARY: GLUCOSE-CAPILLARY: 76 mg/dL (ref 65–99)

## 2017-07-11 LAB — HEPATITIS B SURFACE ANTIGEN: Hepatitis B Surface Ag: NEGATIVE

## 2017-07-11 MED ORDER — PENTAFLUOROPROP-TETRAFLUOROETH EX AERO: 1.0000 "application " | INHALATION_SPRAY | CUTANEOUS | Status: DC | PRN

## 2017-07-11 MED ORDER — INSULIN GLARGINE 100 UNIT/ML ~~LOC~~ SOLN: 35.0000 [IU] | Freq: Every day | SUBCUTANEOUS | 11 refills | Status: AC

## 2017-07-11 MED ORDER — HEPARIN SODIUM (PORCINE) 1000 UNIT/ML DIALYSIS: 1000.0000 [IU] | INTRAMUSCULAR | Status: DC | PRN

## 2017-07-11 MED ORDER — ALTEPLASE 2 MG IJ SOLR: 2.0000 mg | Freq: Once | INTRAMUSCULAR | Status: DC | PRN

## 2017-07-11 MED ORDER — LIDOCAINE HCL (PF) 1 % IJ SOLN: 5.0000 mL | INTRAMUSCULAR | Status: DC | PRN

## 2017-07-11 MED ORDER — SODIUM CHLORIDE 0.9 % IV SOLN: 100.0000 mL | INTRAVENOUS | Status: DC | PRN

## 2017-07-11 MED ORDER — HEPARIN SODIUM (PORCINE) 1000 UNIT/ML DIALYSIS: 20.0000 [IU]/kg | INTRAMUSCULAR | Status: DC | PRN

## 2017-07-11 MED ORDER — LIDOCAINE-PRILOCAINE 2.5-2.5 % EX CREA
1.0000 | TOPICAL_CREAM | CUTANEOUS | Status: DC | PRN
Start: 2017-07-11 — End: 2017-07-11

## 2017-07-11 NOTE — Progress Notes (Signed)
Discharge instructions and medications discussed with patient.  AVS given to patient.  All questions answered.  

## 2017-07-11 NOTE — Progress Notes (Signed)
   Subjective:  Patient seen in dialysis this morning. She declined to work with PT yesterday. She is wondering if and when she will be [place].   Objective:  Vital signs in last 24 hours: Vitals:   07/11/17 0727 07/11/17 0732 07/11/17 0800 07/11/17 0830  BP: (!) 113/57 (!) 108/54 (!) 106/54 (!) 91/49  Pulse: 75 72 75 75  Resp: 18 18 18 18   Temp:      TempSrc:      SpO2:      Weight:      Height:       General: Laying in bed comfortably during dialysis HEENT: [place]/AT Cardiac: RRR, No R/M/G appreciated Pulm: normal effort, CTA Ext: 2+ peripheral edema with woody appearance Neuro: alert and oriented X3  Assessment/Plan:  Dorcas McmurrayLula Shough is a 56 y.o. female with history of ESRD on HD, CHF, insulin-dependent T2DM, HTN, hypothyroidism,  COPD/asthma, HepC, and schizoaffective disorder who presents to the ED with volume overload after missing HD for 1 week.   ESRD on TTS HD  Presented with volume overload in the setting of missing HD for 1 week. Receiving third consecutive dialysis session today. - HD per Nephrology - No placement for outpatient dialysis as of now. Patient was denied [facility] due to alleged verbal threats. Unclear if she will be accepted elsewhere, however attempts to find outpatient HD in the area are still being made.  HFrEF Unknown EF as patient lives in [place] and no records on [place]  - Continue home ASA 81 mg daily  - Nitro patch 0.4 mg q25h  - Continue Simvastatin 20 mg daily  - Continue home Plavix 75 mg daily   HTN BP now stable with dialysis. - Continue home Clonidine 0.3 mg TID + Lisinopril 10 mg daily + Procardia 90 mg daily   T2DM A1C 8.2. On Lantus 50U QHS at home   - Lantus 35U QHS + Novolog 3U TID + SSI-S - CBG monitoring   Reported Hypothyroidism No prior TSH on record. Patient does not know Synthroid dose.    -TSH 1.489, within normal limits  Schizoaffective disorder:  - Continue home  Seroquel 300 mg BID    Fluids: None  Electrolytes: CTM  GI: Renal diet  VTE ppx: SQ heparin    Dispo: Anticipated discharge pending dialysis arrangements.  Darreld McleanVishal Chere Babson, MD Internal Medicine PGY-3

## 2017-07-11 NOTE — Procedures (Signed)
Tolerating treatment and in process of signing off early because "I met my goal".  She is not going to be accepted in the [facility] due to behavior in the recent and remote past.  She was given names and phone numbers of other local dialysis units to contact and advised to work with her home dialysis unit and insurance company to facilitate appointments.  Her other option, which is best in my opinion, is to return to the [place] to her home unit for treatment.  I will sign off.  Elius Etheredge C

## 2017-07-11 NOTE — Progress Notes (Signed)
Notified of pt's desire to leave hospital. Spoke with pt who is adamant that she would like to be discharged, as she is on vacation and does not want to spend it in the hospital. She was counseled on the importance of dialysis and significance of missing sessions. I advised, as the primary team communicated, that exploring other options to establish an HD center (after initial denial due to behavior) while she is in the hospital would be the safest option. [place] in [place] for her full vacation and will not return home early after she was also advised that a second option would be to return to her established home center. She expressed understanding of her situation, and stated she had a plan to set up a local dialysis center on her own with the resources she was provided.

## 2017-07-11 NOTE — Progress Notes (Signed)
Patient refusing Seroquel and dressing change on toe. Patient stated she is going home and she doesn't want to be sleepy nor does she want to bother with a dressing change.  MD notified.

## 2017-07-14 ENCOUNTER — Non-Acute Institutional Stay (HOSPITAL_COMMUNITY)
Admission: EM | Admit: 2017-07-14 | Discharge: 2017-07-14 | Disposition: A | Discharge status: Home / Self Care | Payer: Medicare (Managed Care) | Attending: Emergency Medicine | Admitting: Emergency Medicine

## 2017-07-14 ENCOUNTER — Other Ambulatory Visit: Payer: Self-pay

## 2017-07-14 ENCOUNTER — Encounter (HOSPITAL_COMMUNITY): Payer: Self-pay

## 2017-07-14 ENCOUNTER — Emergency Department (HOSPITAL_COMMUNITY): Payer: Medicare (Managed Care)

## 2017-07-14 DIAGNOSIS — I509 Heart failure, unspecified: Secondary | ICD-10-CM | POA: Diagnosis not present

## 2017-07-14 DIAGNOSIS — Z992 Dependence on renal dialysis: Secondary | ICD-10-CM | POA: Insufficient documentation

## 2017-07-14 DIAGNOSIS — Z794 Long term (current) use of insulin: Secondary | ICD-10-CM | POA: Insufficient documentation

## 2017-07-14 DIAGNOSIS — Z7902 Long term (current) use of antithrombotics/antiplatelets: Secondary | ICD-10-CM | POA: Diagnosis not present

## 2017-07-14 DIAGNOSIS — J449 Chronic obstructive pulmonary disease, unspecified: Secondary | ICD-10-CM | POA: Diagnosis not present

## 2017-07-14 DIAGNOSIS — E039 Hypothyroidism, unspecified: Secondary | ICD-10-CM | POA: Diagnosis not present

## 2017-07-14 DIAGNOSIS — F209 Schizophrenia, unspecified: Secondary | ICD-10-CM | POA: Insufficient documentation

## 2017-07-14 DIAGNOSIS — F17210 Nicotine dependence, cigarettes, uncomplicated: Secondary | ICD-10-CM | POA: Insufficient documentation

## 2017-07-14 DIAGNOSIS — E1122 Type 2 diabetes mellitus with diabetic chronic kidney disease: Secondary | ICD-10-CM | POA: Diagnosis not present

## 2017-07-14 DIAGNOSIS — I120 Hypertensive chronic kidney disease with stage 5 chronic kidney disease or end stage renal disease: Secondary | ICD-10-CM | POA: Insufficient documentation

## 2017-07-14 DIAGNOSIS — Z79899 Other long term (current) drug therapy: Secondary | ICD-10-CM | POA: Diagnosis not present

## 2017-07-14 DIAGNOSIS — Z888 Allergy status to other drugs, medicaments and biological substances status: Secondary | ICD-10-CM | POA: Insufficient documentation

## 2017-07-14 DIAGNOSIS — B182 Chronic viral hepatitis C: Secondary | ICD-10-CM | POA: Diagnosis not present

## 2017-07-14 DIAGNOSIS — N186 End stage renal disease: Secondary | ICD-10-CM | POA: Diagnosis present

## 2017-07-14 DIAGNOSIS — Z7982 Long term (current) use of aspirin: Secondary | ICD-10-CM | POA: Diagnosis not present

## 2017-07-14 DIAGNOSIS — I132 Hypertensive heart and chronic kidney disease with heart failure and with stage 5 chronic kidney disease, or end stage renal disease: Secondary | ICD-10-CM | POA: Insufficient documentation

## 2017-07-14 LAB — BASIC METABOLIC PANEL
Anion gap: 14 (ref 5–15)
BUN: 73 mg/dL — AB (ref 6–20)
CHLORIDE: 99 mmol/L — AB (ref 101–111)
CO2: 22 mmol/L (ref 22–32)
Calcium: 9.5 mg/dL (ref 8.9–10.3)
Creatinine, Ser: 7.65 mg/dL — ABNORMAL HIGH (ref 0.44–1.00)
GFR, EST AFRICAN AMERICAN: 6 mL/min — AB (ref >60)
GFR, EST NON AFRICAN AMERICAN: 5 mL/min — AB (ref >60)
Glucose, Bld: 281 mg/dL — ABNORMAL HIGH (ref 65–99)
POTASSIUM: 4.9 mmol/L (ref 3.5–5.1)
SODIUM: 135 mmol/L (ref 135–145)

## 2017-07-14 LAB — CBC
HEMATOCRIT: 35.2 % — AB (ref 36.0–46.0)
Hemoglobin: 11 g/dL — ABNORMAL LOW (ref 12.0–15.0)
MCH: 27.5 pg (ref 26.0–34.0)
MCHC: 31.3 g/dL (ref 30.0–36.0)
MCV: 88 fL (ref 78.0–100.0)
PLATELETS: 199 10*3/uL (ref 150–400)
RBC: 4 MIL/uL (ref 3.87–5.11)
RDW: 15.2 % (ref 11.5–15.5)
WBC: 6.3 10*3/uL (ref 4.0–10.5)

## 2017-07-14 LAB — PHOSPHORUS: PHOSPHORUS: 4.7 mg/dL — AB (ref 2.5–4.6)

## 2017-07-14 LAB — MAGNESIUM: Magnesium: 1.8 mg/dL (ref 1.7–2.4)

## 2017-07-14 MED ORDER — SODIUM CHLORIDE 0.9 % IV SOLN: 100.0000 mL | INTRAVENOUS | Status: DC | PRN

## 2017-07-14 MED ORDER — ALTEPLASE 2 MG IJ SOLR: 2.0000 mg | Freq: Once | INTRAMUSCULAR | Status: DC | PRN

## 2017-07-14 MED ORDER — LIDOCAINE-PRILOCAINE 2.5-2.5 % EX CREA: 1.0000 "application " | TOPICAL_CREAM | CUTANEOUS | Status: DC | PRN

## 2017-07-14 MED ORDER — LIDOCAINE HCL (PF) 1 % IJ SOLN: 5.0000 mL | INTRAMUSCULAR | Status: DC | PRN

## 2017-07-14 MED ORDER — HEPARIN SODIUM (PORCINE) 1000 UNIT/ML DIALYSIS
1000.0000 [IU] | INTRAMUSCULAR | Status: DC | PRN
  Filled 2017-07-14: qty 1

## 2017-07-14 MED ORDER — PENTAFLUOROPROP-TETRAFLUOROETH EX AERO: 1.0000 "application " | INHALATION_SPRAY | CUTANEOUS | Status: DC | PRN

## 2017-07-14 MED ORDER — IPRATROPIUM-ALBUTEROL 0.5-2.5 (3) MG/3ML IN SOLN
3.0000 mL | Freq: Once | RESPIRATORY_TRACT | Status: AC
  Administered 2017-07-14: 3 mL via RESPIRATORY_TRACT
  Filled 2017-07-14: qty 3

## 2017-07-14 NOTE — Progress Notes (Signed)
Pt signed of dialysis early, only ran about 2.5hours of a 4 hour tx.  Pt became anxious and irritable, yelling loudly for someone to come and take her off now.  VSS post tx.  D/C'd to home with daughter.

## 2017-07-14 NOTE — Procedures (Signed)
Patient seen and examined on Hemodialysis.  No OP center in [place].  Came to ED for routine treatment.  QB450, UF goal 4L  Treatment adjusted as needed.  Bufford ButtnerElizabeth Jazzelle Zhang MD [facility] pgr [phone] 4:52 PM

## 2017-07-14 NOTE — ED Provider Notes (Signed)
Patient called to triage with no answer. I was unable to evaluate this patient.   Latoya Patterson, Latoya Hibner, MD 07/14/17 [phone]

## 2017-07-14 NOTE — ED Notes (Signed)
Ice given to patient per EDP approval

## 2017-07-14 NOTE — ED Notes (Signed)
Pt. given cup of ice per RN. 

## 2017-07-14 NOTE — ED Provider Notes (Signed)
[facility]-EMERGENCY DEPT Provider Note   CSN: [id number] Arrival date & time: 07/14/17  4098     History   Chief Complaint Chief Complaint  Patient presents with  . Shortness of Breath    HPI Latoya Patterson is a 55 y.o. female.  HPI   56 yo F with h/o ESRD, COPD, CHF, hep C here with SOB. Pt currently is visiting [place] from [place]. She was seen and admitted on 9/5 and underwent 3 days of back to back HD. She was sent home with referral to outpt HD but has been unable to find a place per her report, although per records this is because she was verbally abusive. She states that she last dialyzed on Friday. She has since had worsening SOB, orthopnea. She denies CP. She has had a mild dry cough. No nausea, vomiting. No fevers. She plans to return to [place] this week but needs dialysis today per her report. No CP.  Past Medical History:  Diagnosis Date  . Asthma   . CHF (congestive heart failure) (HCC)   . COPD (chronic obstructive pulmonary disease) (HCC)   . Diabetes mellitus without complication (HCC)   . ESRD (end stage renal disease) on dialysis (HCC)   . Hep C w/ coma, chronic (HCC)   . Hypertension   . Renal disorder   . Thyroid disease     Patient Active Problem List   Diagnosis Date Noted  . ESRD (end stage renal disease) (HCC) 07/14/2017  . Controlled type 2 diabetes mellitus with diabetic nephropathy, with long-term current use of insulin (HCC)   . Volume overload 07/08/2017  . Hepatitis C 07/08/2017  . COPD (chronic obstructive pulmonary disease) (HCC) 07/08/2017  . Heart failure (HCC) 07/08/2017  . Diabetes (HCC) 07/08/2017  . Schizoaffective disorder (HCC) 07/08/2017  . Constipation 07/08/2017  . Hypothyroidism 07/08/2017  . Hypertension   . ESRD (end stage renal disease) on dialysis [facility])     Past Surgical History:  Procedure Laterality Date  . COLON SURGERY    . TONSILLECTOMY    . VASCULAR SURGERY      OB History    No data available       Home Medications     Prior to Admission medications   Medication Sig Start Date End Date Taking? Authorizing Provider  albuterol (PROVENTIL HFA;VENTOLIN HFA) 108 (90 Base) MCG/ACT inhaler Inhale 2 puffs into the lungs every 6 (six) hours as needed for wheezing or shortness of breath.   Yes [provider]  aspirin EC 81 MG tablet Take 81 mg by mouth daily.   Yes [provider]  bisacodyl (DULCOLAX) 5 MG EC tablet Take 5 mg by mouth daily.   Yes [provider]  Calcium 600-200 MG-UNIT tablet Take 1 tablet by mouth 2 (two) times daily.   Yes [provider]  cloNIDine (CATAPRES) 0.3 MG tablet Take 0.3 mg by mouth 3 (three) times daily. 06/19/17  Yes [provider]  clopidogrel (PLAVIX) 75 MG tablet Take 75 mg by mouth at bedtime. 06/17/17  Yes [provider]  docusate sodium (COLACE) 100 MG capsule Take 100 mg by mouth 3 (three) times daily.    Yes [provider]  insulin glargine (LANTUS) 100 UNIT/ML injection Inject 0.35 mLs (35 Units total) into the skin at bedtime. 07/11/17  Yes Ginger Carne, MD  insulin lispro (HUMALOG) 100 UNIT/ML injection Inject 5 Units into the skin 3 (three) times daily before meals.   Yes [provider]  lisinopril (PRINIVIL,ZESTRIL) 10 MG tablet Take 10 mg by mouth daily. 06/17/17  Yes [provider]  meclizine (ANTIVERT) 12.5 MG tablet Take 12.5 mg by mouth 3 (three) times daily. 06/06/17  Yes [provider]  methocarbamol (ROBAXIN) 750 MG tablet Take 750 mg by mouth 2 (two) times daily. 06/17/17  Yes [provider]  Multiple Vitamin (MULTIVITAMIN WITH MINERALS) TABS tablet Take 1 tablet by mouth at bedtime.   Yes [provider]  NIFEdipine (PROCARDIA XL/ADALAT-CC) 90 MG 24 hr tablet Take 90 mg by mouth daily. 06/30/17  Yes [provider]  nitroGLYCERIN (NITRODUR - DOSED IN MG/24 HR) 0.4 mg/hr patch Place 1 patch onto the skin daily. 05/07/17  Yes [provider]   QUEtiapine (SEROQUEL) 300 MG tablet Take 300 mg by mouth 2 (two) times daily. 06/06/17  Yes [provider]  senna (SENOKOT) 8.6 MG TABS tablet Take 2 tablets by mouth daily.  06/24/17  Yes [provider]  simvastatin (ZOCOR) 40 MG tablet Take 40 mg by mouth at bedtime. 06/06/17  Yes [provider]    Family History Family History  Problem Relation Age of Onset  . Adopted: Yes    Social History Social History  Substance Use Topics  . Smoking status: Current Every Day Smoker    Packs/day: 0.33  . Smokeless tobacco: Never Used  . Alcohol use No     Allergies   Epinephrine   Review of Systems Review of Systems  Constitutional: Positive for fatigue.  Respiratory: Positive for shortness of breath.   Cardiovascular: Positive for leg swelling.  Neurological: Positive for weakness.  All other systems reviewed and are negative.    Physical Exam Updated Vital Signs BP (!) 143/71   Pulse 66   Temp 98.3 F (36.8 C) (Oral)   Resp 17   SpO2 100%   Physical Exam  Constitutional: She is oriented to person, place, and time. She appears well-developed and well-nourished. No distress.  HENT:  Head: Normocephalic and atraumatic.  Eyes: Conjunctivae are normal.  Neck: Neck supple.  Cardiovascular: Normal rate, regular rhythm and normal heart sounds.  Exam reveals no friction rub.   No murmur heard. Pulmonary/Chest: Effort normal. No respiratory distress. She has no wheezes. She has rales (bibasilar, mild).  Abdominal: She exhibits no distension.  Musculoskeletal: She exhibits edema.  Neurological: She is alert and oriented to person, place, and time. She exhibits normal muscle tone.  Skin: Skin is warm. Capillary refill takes less than 2 seconds.  Psychiatric: She has a normal mood and affect.  Nursing note and vitals reviewed.    ED Treatments / Results  Labs (all labs ordered are listed, but only abnormal results are displayed) Labs Reviewed   BASIC METABOLIC PANEL - Abnormal; Notable for the following:       Result Value   Chloride 99 (*)    Glucose, Bld 281 (*)    BUN 73 (*)    Creatinine, Ser 7.65 (*)    GFR calc non Af Amer 5 (*)    GFR calc Af Amer 6 (*)    All other components within normal limits  CBC - Abnormal; Notable for the following:    Hemoglobin 11.0 (*)    HCT 35.2 (*)    All other components within normal limits  PHOSPHORUS - Abnormal; Notable for the following:    Phosphorus 4.7 (*)    All other components within normal limits  MAGNESIUM    EKG  EKG Interpretation  Date/Time:  Tuesday July 14 2017 08:20:03 EDT Ventricular Rate:  83 PR Interval:  140 QRS Duration: 86 QT Interval:  396 QTC Calculation: 465 R Axis:   105 Text Interpretation:  Normal sinus rhythm Rightward axis Anterior infarct , age undetermined Abnormal ECG Since last EKG, there is slight peaking of TW in anterior leads Otherwise no significant change Confirmed by Shaune PollackIsaacs, Arina Torry [phone]) on 07/14/2017 8:59:31 AM       Radiology Dg Chest 2 View  Result Date: 07/14/2017 CLINICAL DATA:  Shortness of breath. EXAM: CHEST  2 VIEW COMPARISON:  July 08, 2017. FINDINGS: Unchanged cardiomegaly and pulmonary artery enlargement. Pulmonary vascular congestion. No focal consolidation, pleural effusion, or pneumothorax. No acute osseous abnormality. Unchanged left axillary and brachial stents. IMPRESSION: Stable cardiomegaly and pulmonary vascular congestion. Electronically Signed   By: Obie DredgeWilliam T Derry M.D.   On: 07/14/2017 09:09    Procedures Procedures (including critical care time)  Medications Ordered in ED Medications  pentafluoroprop-tetrafluoroeth (GEBAUERS) aerosol 1 application (not administered)  lidocaine (PF) (XYLOCAINE) 1 % injection 5 mL (not administered)  lidocaine-prilocaine (EMLA) cream 1 application (not administered)  0.9 %  sodium chloride infusion (not administered)  0.9 %  sodium chloride infusion (not  administered)  heparin injection 1,000 Units (not administered)  alteplase (CATHFLO ACTIVASE) injection 2 mg (not administered)  ipratropium-albuterol (DUONEB) 0.5-2.5 (3) MG/3ML nebulizer solution 3 mL (3 mLs Nebulization Given 07/14/17 1007)     Initial Impression / Assessment and Plan / ED Course  I have reviewed the triage vital signs and the nursing notes.  Pertinent labs & imaging results that were available during my care of the patient were reviewed by me and considered in my medical decision making (see chart for details).     56 yo M with PMHx ESRD here for dialysis. Last dialyzed on Fri. She is mildly hypervolemic clinically but in no resp distress. satting well on RA. Lab work shows acceptable lytes and ekg is non-ischemic. CXR is c/w mild edema. Discussed with Dr. Signe ColtUpton of Nephrology - will dialyze, likely d/c. Pt encouraged to follow-up with an outpt center versus return to [place] for her regular HD.  Final Clinical Impressions(s) / ED Diagnoses   Final diagnoses:  ESRD (end stage renal disease) (HCC)    New Prescriptions New Prescriptions   No medications on file     Shaune PollackIsaacs, Iyannah Blake, MD 07/14/17 1402

## 2017-07-14 NOTE — ED Notes (Signed)
Pt called 3X, no answer 

## 2017-07-14 NOTE — ED Triage Notes (Signed)
Patient complains of increasing shortness of breath since being admitted to hospital. Reports last dialysis this past Friday. Patient states she is from [place] and not set up to have dialysis here. No CP. Alert and oriented

## 2017-07-14 NOTE — ED Notes (Signed)
Dressing changed on two lower legs wounds. Right leg.

## 2017-07-14 NOTE — ED Notes (Signed)
Pt called 3x to be taken back to room; no response

## 2018-12-24 IMAGING — DX DG CHEST 2V
2 series · 2 of 2 positions shown · non-contrast
Comparison: None.

CLINICAL DATA: Shortness of Breath.  Renal failure

EXAM:
CHEST  2 VIEW

[chest pa]
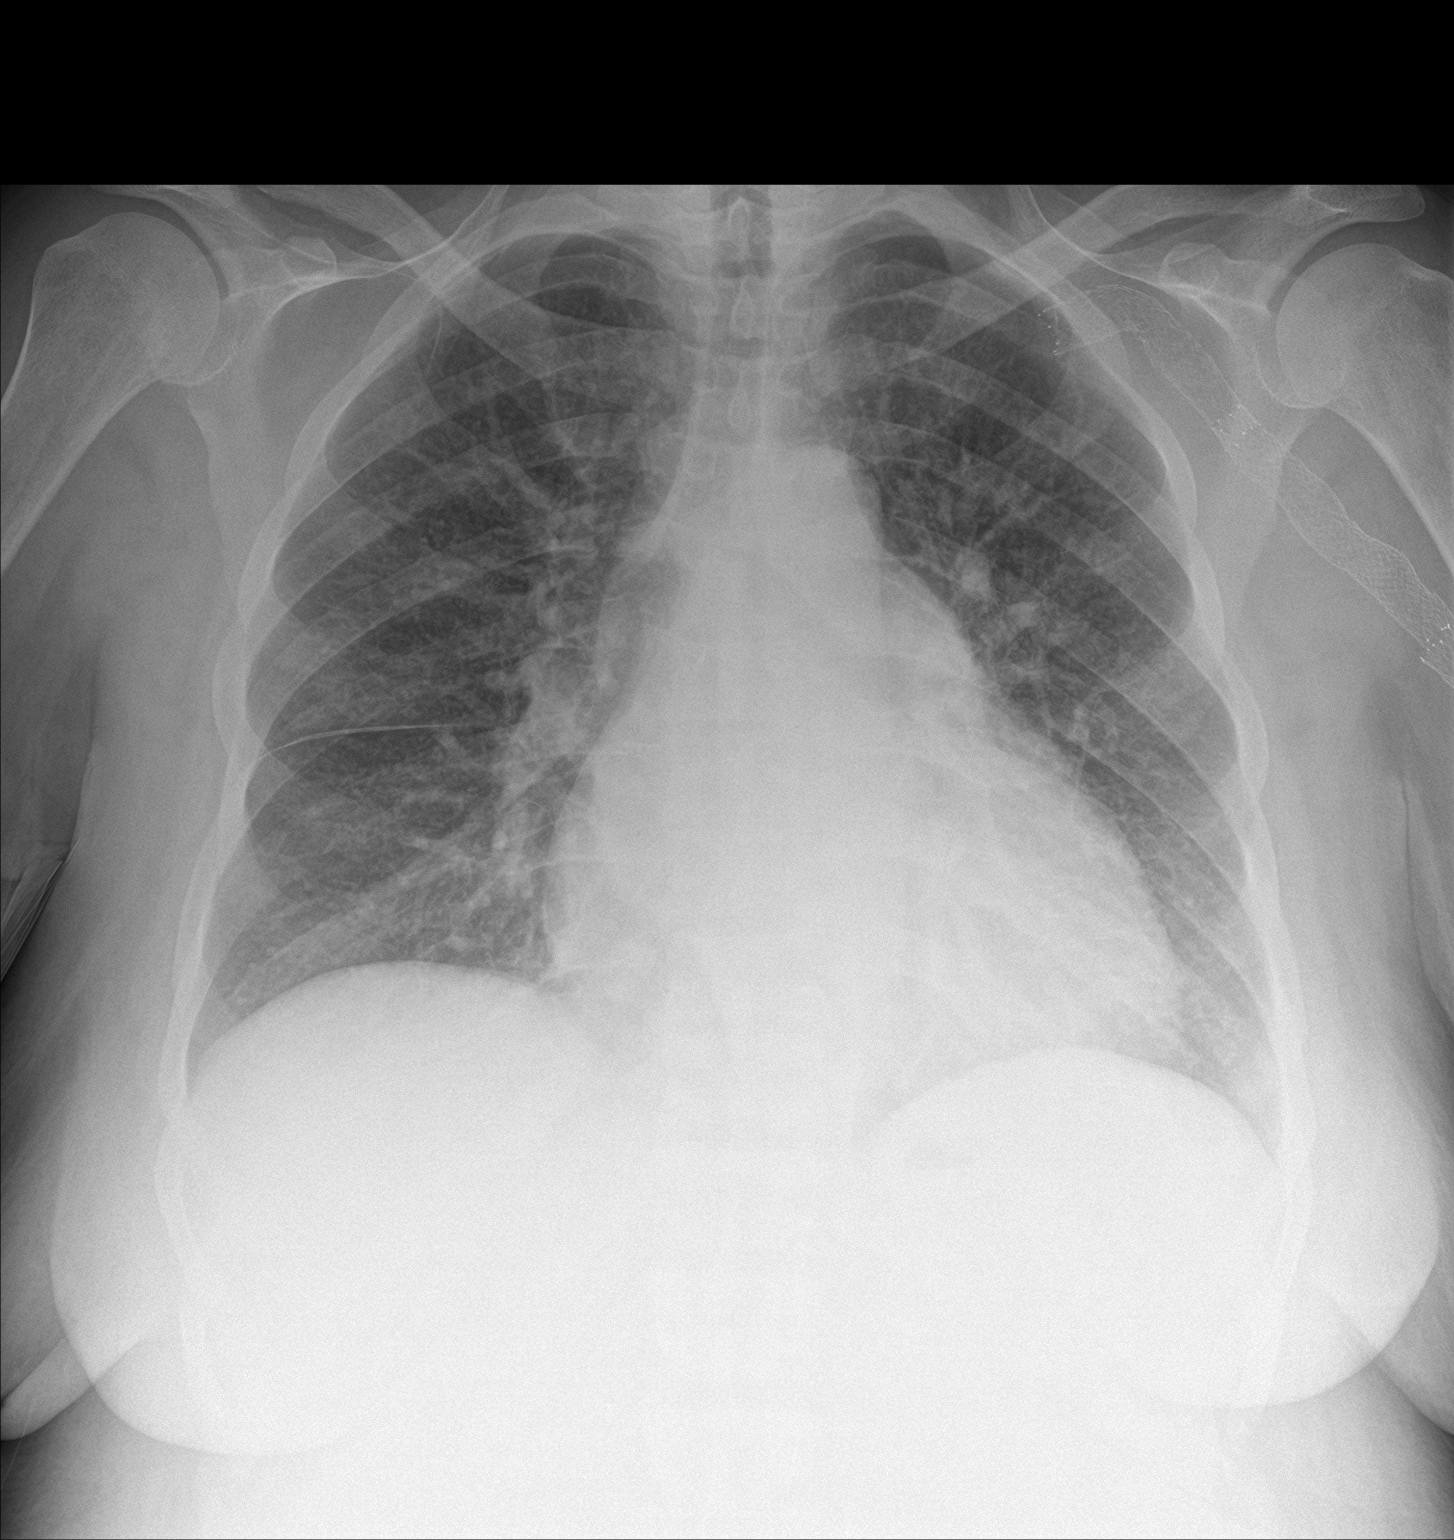

[chest lat]
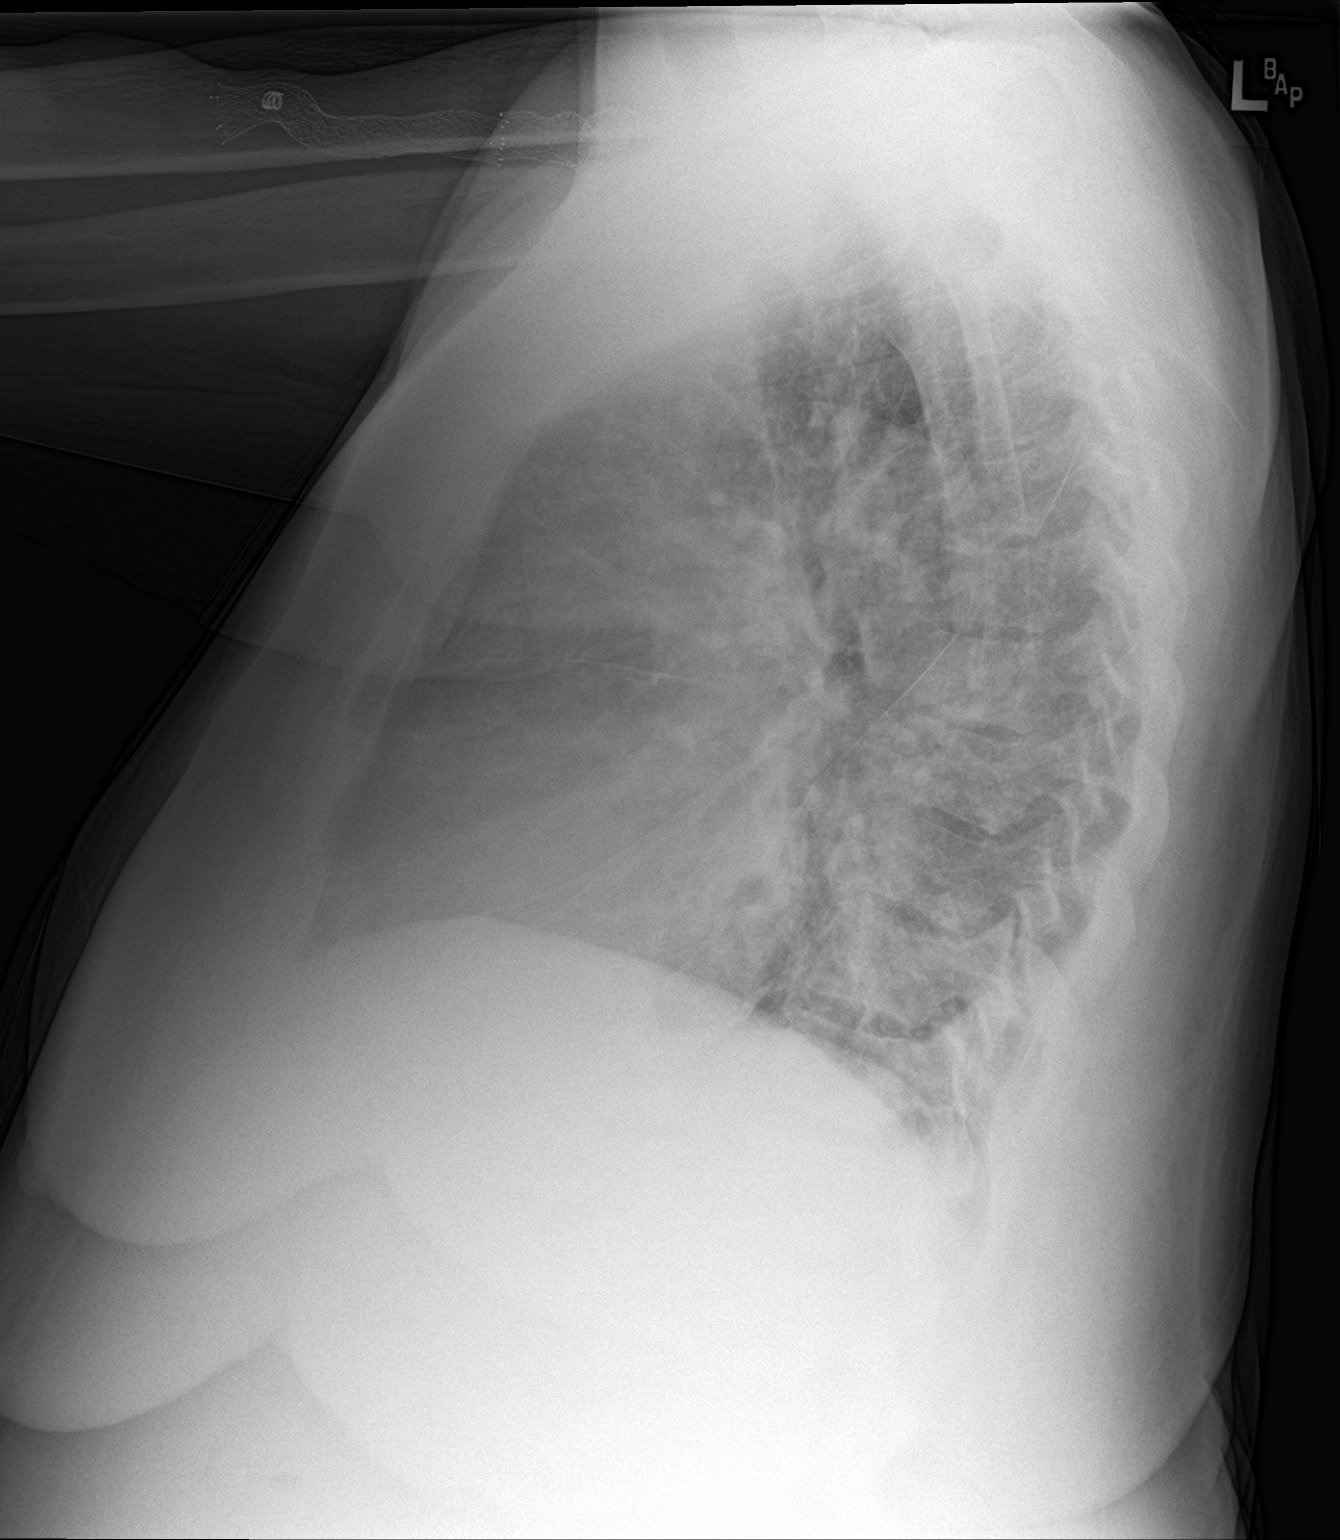

[2 of 2 positions shown; findings below may reference images not displayed]

FINDINGS: There is no edema or consolidation. There is cardiomegaly with
pulmonary venous hypertension. No adenopathy. Left bracheal and
axillary stent present. No evident bone lesions.
IMPRESSION: Pulmonary vascular congestion without frank edema or consolidation.
